# Patient Record
Sex: Female | Born: 1970 | Race: White | Hispanic: No | Smoking: Current some day smoker
Health system: Southern US, Community
[De-identification: ages and names within clinical notes are randomized; demographics above are authoritative.]

## PROBLEM LIST (undated history)

## (undated) DIAGNOSIS — M1990 Unspecified osteoarthritis, unspecified site: Secondary | ICD-10-CM

## (undated) DIAGNOSIS — E785 Hyperlipidemia, unspecified: Secondary | ICD-10-CM

## (undated) DIAGNOSIS — F32A Depression, unspecified: Secondary | ICD-10-CM

## (undated) DIAGNOSIS — J45909 Unspecified asthma, uncomplicated: Secondary | ICD-10-CM

## (undated) DIAGNOSIS — E079 Disorder of thyroid, unspecified: Secondary | ICD-10-CM

## (undated) DIAGNOSIS — F909 Attention-deficit hyperactivity disorder, unspecified type: Secondary | ICD-10-CM

## (undated) DIAGNOSIS — K5792 Diverticulitis of intestine, part unspecified, without perforation or abscess without bleeding: Secondary | ICD-10-CM

## (undated) HISTORY — DX: Unspecified asthma, uncomplicated: J45.909

## (undated) HISTORY — PX: OTHER SURGICAL HISTORY: SHX169

## (undated) HISTORY — DX: Attention-deficit hyperactivity disorder, unspecified type: F90.9

## (undated) HISTORY — DX: Hyperlipidemia, unspecified: E78.5

## (undated) HISTORY — DX: Unspecified osteoarthritis, unspecified site: M19.90

## (undated) HISTORY — DX: Disorder of thyroid, unspecified: E07.9

## (undated) HISTORY — PX: ABDOMINAL HYSTERECTOMY: SHX81

## (undated) HISTORY — PX: APPENDECTOMY: SHX54

## (undated) HISTORY — DX: Depression, unspecified: F32.A

## (undated) HISTORY — DX: Diverticulitis of intestine, part unspecified, without perforation or abscess without bleeding: K57.92

## (undated) HISTORY — PX: KNEE ARTHROSCOPY: SUR90

## (undated) HISTORY — PX: BACK SURGERY: SHX140

---

## 1998-01-05 ENCOUNTER — Other Ambulatory Visit: Admission: RE | Admit: 1998-01-05 | Discharge: 1998-01-05 | Payer: Self-pay | Admitting: Gynecology

## 1999-04-26 ENCOUNTER — Other Ambulatory Visit: Admission: RE | Admit: 1999-04-26 | Discharge: 1999-04-26 | Payer: Self-pay | Admitting: Family Medicine

## 1999-06-09 ENCOUNTER — Ambulatory Visit (HOSPITAL_COMMUNITY): Admission: RE | Admit: 1999-06-09 | Discharge: 1999-06-09 | Payer: Self-pay | Admitting: Gynecology

## 1999-06-09 ENCOUNTER — Encounter (INDEPENDENT_AMBULATORY_CARE_PROVIDER_SITE_OTHER): Payer: Self-pay | Admitting: Specialist

## 2000-05-23 ENCOUNTER — Other Ambulatory Visit: Admission: RE | Admit: 2000-05-23 | Discharge: 2000-05-23 | Payer: Self-pay | Admitting: Gynecology

## 2001-05-26 ENCOUNTER — Other Ambulatory Visit: Admission: RE | Admit: 2001-05-26 | Discharge: 2001-05-26 | Payer: Self-pay | Admitting: Gynecology

## 2002-02-19 ENCOUNTER — Ambulatory Visit (HOSPITAL_COMMUNITY): Admission: RE | Admit: 2002-02-19 | Discharge: 2002-02-19 | Payer: Self-pay | Admitting: Gynecology

## 2002-06-02 ENCOUNTER — Other Ambulatory Visit: Admission: RE | Admit: 2002-06-02 | Discharge: 2002-06-02 | Payer: Self-pay | Admitting: Gynecology

## 2003-06-09 ENCOUNTER — Other Ambulatory Visit: Admission: RE | Admit: 2003-06-09 | Discharge: 2003-06-09 | Payer: Self-pay | Admitting: Obstetrics and Gynecology

## 2004-06-02 ENCOUNTER — Ambulatory Visit: Payer: Self-pay | Admitting: Family Medicine

## 2004-08-03 ENCOUNTER — Ambulatory Visit: Payer: Self-pay | Admitting: Family Medicine

## 2004-11-06 ENCOUNTER — Other Ambulatory Visit: Admission: RE | Admit: 2004-11-06 | Discharge: 2004-11-06 | Payer: Self-pay | Admitting: Obstetrics and Gynecology

## 2005-07-22 ENCOUNTER — Inpatient Hospital Stay (HOSPITAL_COMMUNITY): Admission: AD | Admit: 2005-07-22 | Discharge: 2005-07-22 | Payer: Self-pay | Admitting: Obstetrics and Gynecology

## 2005-12-25 ENCOUNTER — Encounter: Admission: RE | Admit: 2005-12-25 | Discharge: 2005-12-25 | Payer: Self-pay | Admitting: Neurological Surgery

## 2007-01-03 ENCOUNTER — Ambulatory Visit (HOSPITAL_COMMUNITY): Admission: RE | Admit: 2007-01-03 | Discharge: 2007-01-03 | Payer: Self-pay | Admitting: Neurosurgery

## 2008-09-30 DIAGNOSIS — I10 Essential (primary) hypertension: Secondary | ICD-10-CM | POA: Insufficient documentation

## 2008-09-30 DIAGNOSIS — J45909 Unspecified asthma, uncomplicated: Secondary | ICD-10-CM | POA: Insufficient documentation

## 2008-11-29 ENCOUNTER — Emergency Department (HOSPITAL_COMMUNITY): Admission: EM | Admit: 2008-11-29 | Discharge: 2008-11-29 | Payer: Self-pay | Admitting: Emergency Medicine

## 2009-01-16 IMAGING — CT CT CERVICAL SPINE W/ CM
3 of 15 series · 6 of 33 positions shown, 7 images · non-contrast
Comparison: none

CLINICAL DATA: Back pain, leg pain, neck pain, arm pain.
 MYELOGRAM - LUMBAR COMPONENT:
TECHNIQUE: Multidetector CT imaging of the cervical spine was performed after intrathecal injection of contrast.  Multiplanar CT image reconstructions were also generated.
TECHNIQUE: Multidetector CT imaging of the lumbar spine was performed after intrathecal injection of contrast.  Multiplanar CT image reconstructions were also generated.

[Series 5: l-spine helical · axial · 0.28mm/px · z∈[-683,-483]mm · 3 of 81 slices shown, 4 images]
[im 1/81  soft-tissue]
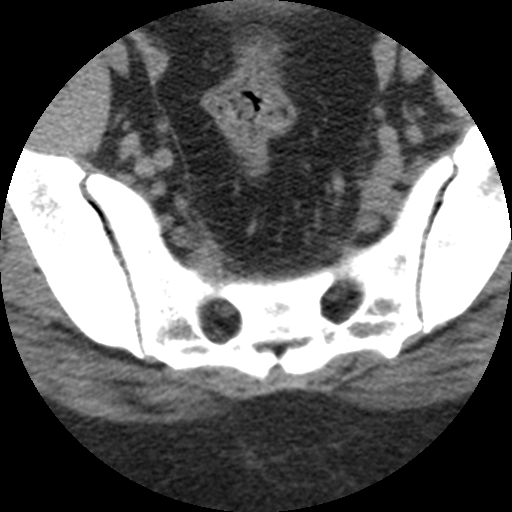
[im 1/81  bone]
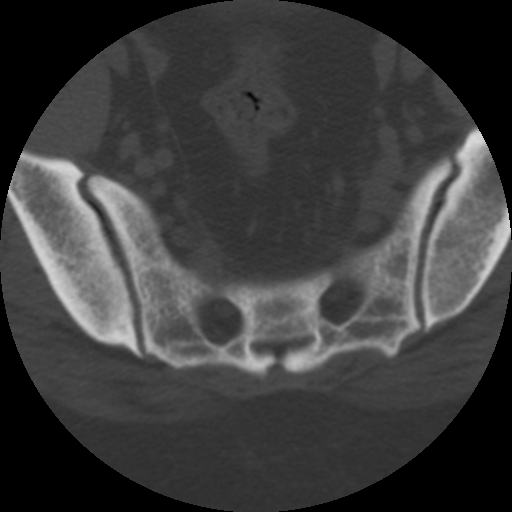
[im 41/81  bone]
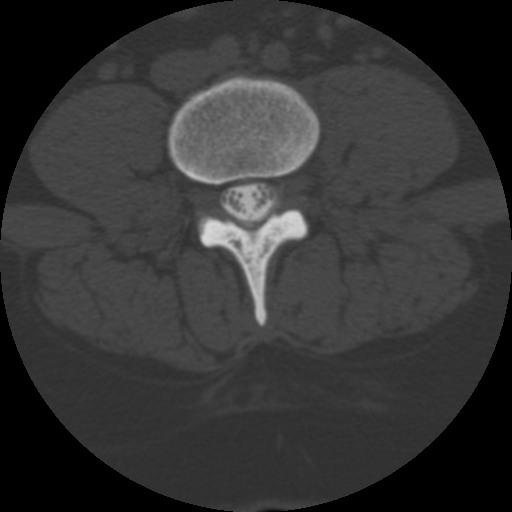
[im 81/81  bone]
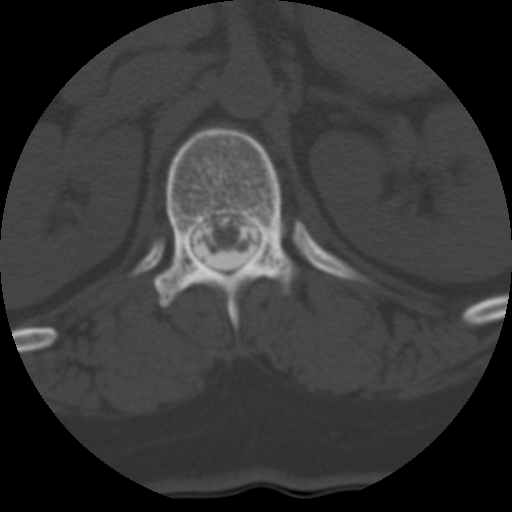

[Series 103: reformatted · sagittal · 0.29mm/px · 2 of 36 slices shown (1 of 2)]
[im 12/36  bone]
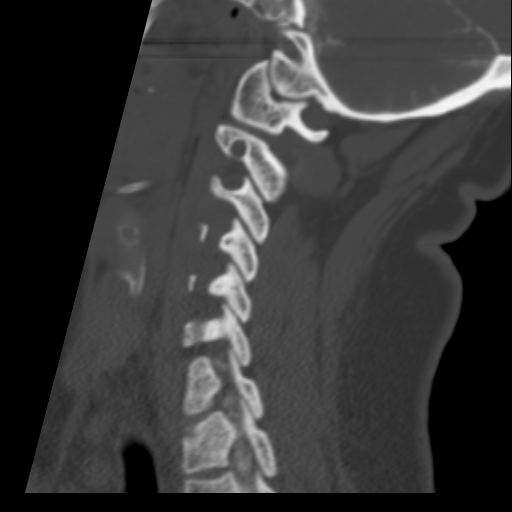
[im 24/36  bone]
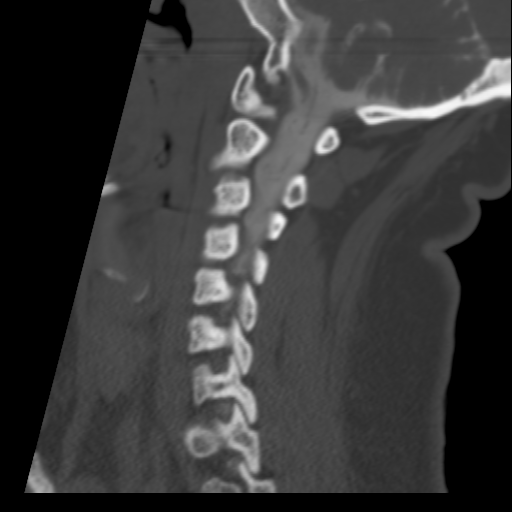

[Series 106: reformatted · sagittal · 0.39mm/px · 1 of 38 slices shown (2 of 2)]
[im 19/38  bone]
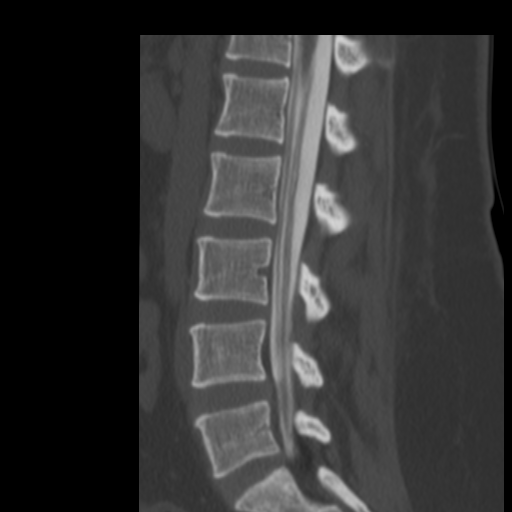

[6 of 33 positions shown; findings below may reference images not displayed]

FINDINGS: Lumbar puncture was performed from a right-sided approach at the L3-4 interlaminar space by Dr. Dzu.
 The lumbar region appears normal without extradural defect or apparent neural compression.
IMPRESSION: Normal lumbar region.
 MYELOGRAM - CERVICAL COMPONENT:
FINDINGS: There is a small anterior extradural defect at C6-7 which does not appear to have any effect upon the spinal cord or root sleeves.  All other levels appear normal.
IMPRESSION: Small anterior extradural defect at C6-7 without apparent neural compression.
 CT CERVICAL SPINE WITH CONTRAST (POST-MYELOGRAM):
FINDINGS: Alignment is normal.  There is a very minimal disc bulge at C6-7, but otherwise the region appears normal.  No effect upon the cord.  No foraminal encroachment.  No facet arthropathy.
IMPRESSION: Negative except for a very minimal disc bulge at C6-7.
 CT LUMBAR SPINE WITH CONTRAST (POST-MYELOGRAM):
FINDINGS: Alignment is normal.  Discs are normal in height and morphology.  The canal and foramina are widely patent.   No osseous or articular abnormality.
IMPRESSION: Normal examination of the lumbar region.

## 2010-05-03 LAB — DIFFERENTIAL
Eosinophils Absolute: 0.1 10*3/uL (ref 0.0–0.7)
Lymphocytes Relative: 24 % (ref 12–46)
Lymphs Abs: 2.6 10*3/uL (ref 0.7–4.0)
Monocytes Relative: 7 % (ref 3–12)
Neutro Abs: 7.2 10*3/uL (ref 1.7–7.7)
Neutrophils Relative %: 68 % (ref 43–77)

## 2010-05-03 LAB — POCT I-STAT, CHEM 8
BUN: 9 mg/dL (ref 6–23)
Calcium, Ion: 1.07 mmol/L — ABNORMAL LOW (ref 1.12–1.32)
Chloride: 107 mEq/L (ref 96–112)
Glucose, Bld: 103 mg/dL — ABNORMAL HIGH (ref 70–99)

## 2010-05-03 LAB — RAPID URINE DRUG SCREEN, HOSP PERFORMED
Amphetamines: NOT DETECTED
Barbiturates: NOT DETECTED
Cocaine: NOT DETECTED
Opiates: NOT DETECTED

## 2010-05-03 LAB — URINALYSIS, ROUTINE W REFLEX MICROSCOPIC
Glucose, UA: NEGATIVE mg/dL
Leukocytes, UA: NEGATIVE
Protein, ur: NEGATIVE mg/dL
Specific Gravity, Urine: 1.02 (ref 1.005–1.030)

## 2010-05-03 LAB — CBC
MCV: 101.3 fL — ABNORMAL HIGH (ref 78.0–100.0)
Platelets: 384 10*3/uL (ref 150–400)
RBC: 4.25 MIL/uL (ref 3.87–5.11)
WBC: 10.6 10*3/uL — ABNORMAL HIGH (ref 4.0–10.5)

## 2010-05-03 LAB — URINE MICROSCOPIC-ADD ON

## 2010-05-03 LAB — ETHANOL: Alcohol, Ethyl (B): <5 mg/dL (ref 0–10)

## 2010-05-03 LAB — URINE CULTURE
Colony Count: NO GROWTH
Culture: NO GROWTH

## 2010-06-16 NOTE — Op Note (Signed)
[facility]  Patient:    Theresa Bowman, Theresa Bowman                 MRN: [id number] Proc. Date: 06/09/99 Adm. Date:  [id number] Disc. Date: [id number] Attending:  Susa Raring                           Operative Report  PREOPERATIVE DIAGNOSES: 1. Abnormal cervical cytology. 2. Inadequate colposcopy.  POSTOPERATIVE DIAGNOSES: 1. Abnormal cervical cytology. 2. Inadequate colposcopy (Pending pathology report).  OPERATION: 1. Cold knife penetration of the cervix. 2. Dilatation and curettage.  SURGEON:  Luvenia Redden, M.D.  DESCRIPTION OF PROCEDURE:  Under good anesthesia the patient was prepped and draped in a sterile manner.  Bimanual examination revealed a small anterior uterus and no adnexal masses.  The cervix was grasped with a tenaculum, and sounded the endometrial cavity to a depth of three inches.  A #1 Vicryl suture was placed submucosally around the circumference of the cervix, approximately one inch up the cervix.  This was placed submucosally in bites around the cervix in the manner of a cerclage.  A #11 knife blade was used to excise a cone-shaped portion of tissue centrally from the cervix.  The specimen was tagged at 12 oclock with a suture.  Following this, the endometrial cavity was scraped and a small amount of tissue was obtained.  The suture was tied down snugly against the uterine sound to assure patency of the canal. The operative site was observed, there was no excessive bleeding.  The procedure was then terminated and the patient removed to the recovery room in good condition.  ESTIMATED BLOOD LOSS:  50 cc; none was replaced. DD:  06/09/99 TD:  06/13/99 Job: [id number]

## 2010-06-16 NOTE — Op Note (Signed)
NAME:  Theresa Bowman, Theresa Bowman                         ACCOUNT NO.:  [id number]   MEDICAL RECORD NO.:  [id number]                   PATIENT TYPE:  AMB   LOCATION:  SDC                                  FACILITY:  [facility]   PHYSICIAN:  Luvenia Redden, M.D.                DATE OF BIRTH:  14-Jul-1970   DATE OF PROCEDURE:  02/19/2002  DATE OF DISCHARGE:                                 OPERATIVE REPORT   PREOPERATIVE DIAGNOSIS:  Sterilization request.   POSTOPERATIVE DIAGNOSIS:  Sterilization request.   OPERATION:  Laparoscopic tubal sterilization.   SURGEON:  Luvenia Redden, M.D.   DESCRIPTION OF PROCEDURE:  Under good anesthesia, the patient was prepped  and draped in a sterile manner.  The bladder was catheterized and a Hulka  tenaculum was placed on the uterus to achieve mobility.  A subumbilical  incision was made approximately 1 cm in length.  The Veress needle was  introduced into the abdomen.  However, I could not be really comfortable  with the pressure readings and I was not that sure that really there was  placement was good and never did get a single digit pressure reading even an  attempt to introduce the needle through the cul-de-sac was not successful.  The decision was made to change to the open laparoscopic approach, which I  did by enlarging the incision just a tad.  Subcutaneous tissues were  separated bluntly.  The fascia was reached and incised vertically.  Blunt  dissection mobilized the rectus muscle to laterally.  The peritoneum was  identified, incised sharply, and the peritoneal cavity was opened.  On  feeling in the peritoneal cavity, it was smooth.  Although, it felt like  some omental adhesions just to the right of the midline and to the right of  the incision.  At any rate, the cannula was placed in the incision  intraperitoneally and the scope was introduced.  The peritoneal cavity was  then insufflated with carbon dioxide and pneumoperitoneum was achieved.  There  were in fact some adhesions to the anterior abdominal wall towards the  left lower quadrant.  These were not disrupted nor were they taken down  since the patient had no complaints.  The fallopian tubes and ovaries were  reviewed and they were entirely normal.  The uterus was normal.  The pelvic  peritoneum was normal.  The right fallopian tube was identified throughout  its length.  It was grasped at the junction of the proximal middle thirds,  tented up and cauterized with the bipolar instrument to zero.  Once the tube  was cauterized, the mid portion of the cauterized area was transected and  there was no bleeding from this site.  The left fallopian tube was  identified throughout its length and isolated.  The tube was cauterized in a  junction of the proximal middle thirds and the cauterized area was then  transected in the mid portion.  There was no bleeding from these sites at  regular pressure nor under reduced pressure.  The pneumoperitoneum was then  totally released.  Scope and cannula were removed.  The fascia was then  closed using interrupted figure-of-eight zero Vicryl sutures.  Subcutaneous  tissue was approximated with some Vicryl interrupted sutures and the skin  was closed with a subcuticular 3-0 plain catgut.  Dermabond was applied and  a Band-Aid was applied.  The patient tolerated the procedure well and was  removed to recovery in good condition.  Estimated blood loss was 20 cc or  less, none was replaced.                                                Luvenia Redden, M.D.    WSB/MEDQ  D:  02/19/2002  T:  02/19/2002  Job:  [id number]

## 2015-09-30 ENCOUNTER — Other Ambulatory Visit: Payer: Self-pay | Admitting: Orthopedic Surgery

## 2015-09-30 DIAGNOSIS — M545 Low back pain: Secondary | ICD-10-CM

## 2015-10-17 ENCOUNTER — Encounter (HOSPITAL_COMMUNITY): Payer: Self-pay | Admitting: *Deleted

## 2015-12-12 DIAGNOSIS — N179 Acute kidney failure, unspecified: Secondary | ICD-10-CM

## 2015-12-12 DIAGNOSIS — T1491XA Suicide attempt, initial encounter: Secondary | ICD-10-CM

## 2015-12-12 DIAGNOSIS — F1910 Other psychoactive substance abuse, uncomplicated: Secondary | ICD-10-CM

## 2016-08-14 ENCOUNTER — Telehealth (INDEPENDENT_AMBULATORY_CARE_PROVIDER_SITE_OTHER): Payer: Self-pay | Admitting: Orthopedic Surgery

## 2016-08-14 NOTE — Telephone Encounter (Signed)
Returned call to patient got recording voicemail box have not been set up yet.   Could not leave message

## 2016-08-29 ENCOUNTER — Ambulatory Visit (INDEPENDENT_AMBULATORY_CARE_PROVIDER_SITE_OTHER): Payer: Self-pay | Admitting: Orthopedic Surgery

## 2016-08-29 ENCOUNTER — Encounter (INDEPENDENT_AMBULATORY_CARE_PROVIDER_SITE_OTHER): Payer: Self-pay | Admitting: Orthopedic Surgery

## 2016-08-29 DIAGNOSIS — G8929 Other chronic pain: Secondary | ICD-10-CM

## 2016-08-29 DIAGNOSIS — M5441 Lumbago with sciatica, right side: Secondary | ICD-10-CM
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
## 2016-08-31 ENCOUNTER — Telehealth (INDEPENDENT_AMBULATORY_CARE_PROVIDER_SITE_OTHER): Payer: Self-pay

## 2016-08-31 MED ORDER — GABAPENTIN 100 MG PO CAPS: 100.0000 mg | ORAL_CAPSULE | Freq: Three times a day (TID) | ORAL | 0 refills | Status: DC

## 2016-08-31 NOTE — Telephone Encounter (Signed)
Patient stated that Rx for Neurontin was not at her pharmacy.  Would like for Rx to be sent to [facility].  Cb# is [phone].  Please advise.  Thank You.

## 2016-08-31 NOTE — Addendum Note (Signed)
Addended byPrescott Parma: Ladine Kiper on: 08/31/2016 10:43 AM   Modules accepted: Orders

## 2016-08-31 NOTE — Telephone Encounter (Signed)
Resubmitted. Advised patient.

## 2016-09-08 ENCOUNTER — Other Ambulatory Visit: Payer: Self-pay

## 2020-06-28 ENCOUNTER — Ambulatory Visit: Payer: Self-pay | Admitting: Nurse Practitioner
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
## 2020-07-13 ENCOUNTER — Other Ambulatory Visit: Payer: Self-pay | Admitting: Nurse Practitioner

## 2020-07-13 ENCOUNTER — Encounter: Payer: Self-pay | Admitting: Nurse Practitioner

## 2020-07-13 ENCOUNTER — Other Ambulatory Visit: Payer: Self-pay

## 2020-07-13 ENCOUNTER — Ambulatory Visit (INDEPENDENT_AMBULATORY_CARE_PROVIDER_SITE_OTHER): Payer: 59 | Admitting: Nurse Practitioner

## 2020-07-13 VITALS — BP 124/80 | HR 92 | Resp 20 | Ht 60.0 in | Wt 200.0 lb

## 2020-07-13 DIAGNOSIS — E079 Disorder of thyroid, unspecified: Secondary | ICD-10-CM

## 2020-07-13 DIAGNOSIS — F1210 Cannabis abuse, uncomplicated: Secondary | ICD-10-CM

## 2020-07-13 DIAGNOSIS — F1910 Other psychoactive substance abuse, uncomplicated: Secondary | ICD-10-CM

## 2020-07-13 DIAGNOSIS — G8929 Other chronic pain: Secondary | ICD-10-CM

## 2020-07-13 DIAGNOSIS — Z1231 Encounter for screening mammogram for malignant neoplasm of breast: Secondary | ICD-10-CM

## 2020-07-13 DIAGNOSIS — R748 Abnormal levels of other serum enzymes: Secondary | ICD-10-CM | POA: Diagnosis not present

## 2020-07-13 DIAGNOSIS — Z7689 Persons encountering health services in other specified circumstances: Secondary | ICD-10-CM

## 2020-07-13 DIAGNOSIS — F1121 Opioid dependence, in remission: Secondary | ICD-10-CM

## 2020-07-13 DIAGNOSIS — F1320 Sedative, hypnotic or anxiolytic dependence, uncomplicated: Secondary | ICD-10-CM

## 2020-07-13 MED ORDER — GABAPENTIN 100 MG PO CAPS: 100.0000 mg | ORAL_CAPSULE | Freq: Three times a day (TID) | ORAL | 1 refills | Status: DC

## 2020-07-13 NOTE — Patient Instructions (Signed)
Return tomorrow for fasting labs  Preventive Care 59-48 Years Old, Female Preventive care refers to lifestyle choices and visits with your health care provider that can promote health and wellness. This includes: A yearly physical exam. This is also called an annual wellness visit. Regular dental and eye exams. Immunizations. Screening for certain conditions. Healthy lifestyle choices, such as: Eating a healthy diet. Getting regular exercise. Not using drugs or products that contain nicotine and tobacco. Limiting alcohol use. What can I expect for my preventive care visit? Physical exam Your health care provider will check your: Height and weight. These may be used to calculate your BMI (body mass index). BMI is a measurement that tells if you are at a healthy weight. Heart rate and blood pressure. Body temperature. Skin for abnormal spots. Counseling Your health care provider may ask you questions about your: Past medical problems. Family's medical history. Alcohol, tobacco, and drug use. Emotional well-being. Home life and relationship well-being. Sexual activity. Diet, exercise, and sleep habits. Work and work Statistician. Access to firearms. Method of birth control. Menstrual cycle. Pregnancy history. What immunizations do I need?  Vaccines are usually given at various ages, according to a schedule. Your health care provider will recommend vaccines for you based on your age, medicalhistory, and lifestyle or other factors, such as travel or where you work. What tests do I need? Blood tests Lipid and cholesterol levels. These may be checked every 5 years, or more often if you are over 81 years old. Hepatitis C test. Hepatitis B test. Screening Lung cancer screening. You may have this screening every year starting at age 51 if you have a 30-pack-year history of smoking and currently smoke or have quit within the past 15 years. Colorectal cancer screening. All adults  should have this screening starting at age 31 and continuing until age 67. Your health care provider may recommend screening at age 63 if you are at increased risk. You will have tests every 1-10 years, depending on your results and the type of screening test. Diabetes screening. This is done by checking your blood sugar (glucose) after you have not eaten for a while (fasting). You may have this done every 1-3 years. Mammogram. This may be done every 1-2 years. Talk with your health care provider about when you should start having regular mammograms. This may depend on whether you have a family history of breast cancer. BRCA-related cancer screening. This may be done if you have a family history of breast, ovarian, tubal, or peritoneal cancers. Pelvic exam and Pap test. This may be done every 3 years starting at age 55. Starting at age 3, this may be done every 5 years if you have a Pap test in combination with an HPV test. Other tests STD (sexually transmitted disease) testing, if you are at risk. Bone density scan. This is done to screen for osteoporosis. You may have this scan if you are at high risk for osteoporosis. Talk with your health care provider about your test results, treatment options,and if necessary, the need for more tests. Follow these instructions at home: Eating and drinking  Eat a diet that includes fresh fruits and vegetables, whole grains, lean protein, and low-fat dairy products. Take vitamin and mineral supplements as recommended by your health care provider. Do not drink alcohol if: Your health care provider tells you not to drink. You are pregnant, may be pregnant, or are planning to become pregnant. If you drink alcohol: Limit how much you have to  0-1 drink a day. Be aware of how much alcohol is in your drink. In the [place] one drink equals one 12 oz bottle of beer (355 mL), one 5 oz glass of wine (148 mL), or one 1 oz glass of hard liquor (44  mL).  Lifestyle Take daily care of your teeth and gums. Brush your teeth every morning and night with fluoride toothpaste. Floss one time each day. Stay active. Exercise for at least 30 minutes 5 or more days each week. Do not use any products that contain nicotine or tobacco, such as cigarettes, e-cigarettes, and chewing tobacco. If you need help quitting, ask your health care provider. Do not use drugs. If you are sexually active, practice safe sex. Use a condom or other form of protection to prevent STIs (sexually transmitted infections). If you do not wish to become pregnant, use a form of birth control. If you plan to become pregnant, see your health care provider for a prepregnancy visit. If told by your health care provider, take low-dose aspirin daily starting at age 42. Find healthy ways to cope with stress, such as: Meditation, yoga, or listening to music. Journaling. Talking to a trusted person. Spending time with friends and family. Safety Always wear your seat belt while driving or riding in a vehicle. Do not drive: If you have been drinking alcohol. Do not ride with someone who has been drinking. When you are tired or distracted. While texting. Wear a helmet and other protective equipment during sports activities. If you have firearms in your house, make sure you follow all gun safety procedures. What's next? Visit your health care provider once a year for an annual wellness visit. Ask your health care provider how often you should have your eyes and teeth checked. Stay up to date on all vaccines. This information is not intended to replace advice given to you by your health care provider. Make sure you discuss any questions you have with your healthcare provider. Document Revised: 10/20/2019 Document Reviewed: 09/26/2017 Elsevier Patient Education  [place], Female Adopting a healthy lifestyle and getting preventive care are important in  promoting health and wellness. Ask your health care provider about: The right schedule for you to have regular tests and exams. Things you can do on your own to prevent diseases and keep yourself healthy. What should I know about diet, weight, and exercise? Eat a healthy diet  Eat a diet that includes plenty of vegetables, fruits, low-fat dairy products, and lean protein. Do not eat a lot of foods that are high in solid fats, added sugars, or sodium.  Maintain a healthy weight Body mass index (BMI) is used to identify weight problems. It estimates body fat based on height and weight. Your health care provider can help determineyour BMI and help you achieve or maintain a healthy weight. Get regular exercise Get regular exercise. This is one of the most important things you can do for your health. Most adults should: Exercise for at least 150 minutes each week. The exercise should increase your heart rate and make you sweat (moderate-intensity exercise). Do strengthening exercises at least twice a week. This is in addition to the moderate-intensity exercise. Spend less time sitting. Even light physical activity can be beneficial. Watch cholesterol and blood lipids Have your blood tested for lipids and cholesterol at 48 years of age, then havethis test every 5 years. Have your cholesterol levels checked more often if: Your lipid or cholesterol levels are high. You  are older than 48 years of age. You are at high risk for heart disease. What should I know about cancer screening? Depending on your health history and family history, you may need to have cancer screening at various ages. This may include screening for: Breast cancer. Cervical cancer. Colorectal cancer. Skin cancer. Lung cancer. What should I know about heart disease, diabetes, and high blood pressure? Blood pressure and heart disease High blood pressure causes heart disease and increases the risk of stroke. This is more likely  to develop in people who have high blood pressure readings, are of African descent, or are overweight. Have your blood pressure checked: Every 3-5 years if you are 40-63 years of age. Every year if you are 65 years old or older. Diabetes Have regular diabetes screenings. This checks your fasting blood sugar level. Have the screening done: Once every three years after age 42 if you are at a normal weight and have a low risk for diabetes. More often and at a younger age if you are overweight or have a high risk for diabetes. What should I know about preventing infection? Hepatitis B If you have a higher risk for hepatitis B, you should be screened for this virus. Talk with your health care provider to find out if you are at risk forhepatitis B infection. Hepatitis C Testing is recommended for: Everyone born from [birth year] through 1963. Anyone with known risk factors for hepatitis C. Sexually transmitted infections (STIs) Get screened for STIs, including gonorrhea and chlamydia, if: You are sexually active and are younger than 48 years of age. You are older than 48 years of age and your health care provider tells you that you are at risk for this type of infection. Your sexual activity has changed since you were last screened, and you are at increased risk for chlamydia or gonorrhea. Ask your health care provider if you are at risk. Ask your health care provider about whether you are at high risk for HIV. Your health care provider may recommend a prescription medicine to help prevent HIV infection. If you choose to take medicine to prevent HIV, you should first get tested for HIV. You should then be tested every 3 months for as long as you are taking the medicine. Pregnancy If you are about to stop having your period (premenopausal) and you may become pregnant, seek counseling before you get pregnant. Take 400 to 800 micrograms (mcg) of folic acid every day if you become pregnant. Ask for birth  control (contraception) if you want to prevent pregnancy. Osteoporosis and menopause Osteoporosis is a disease in which the bones lose minerals and strength with aging. This can result in bone fractures. If you are 40 years old or older, or if you are at risk for osteoporosis and fractures, ask your health care provider if you should: Be screened for bone loss. Take a calcium or vitamin D supplement to lower your risk of fractures. Be given hormone replacement therapy (HRT) to treat symptoms of menopause. Follow these instructions at home: Lifestyle Do not use any products that contain nicotine or tobacco, such as cigarettes, e-cigarettes, and chewing tobacco. If you need help quitting, ask your health care provider. Do not use street drugs. Do not share needles. Ask your health care provider for help if you need support or information about quitting drugs. Alcohol use Do not drink alcohol if: Your health care provider tells you not to drink. You are pregnant, may be pregnant, or are planning to  become pregnant. If you drink alcohol: Limit how much you use to 0-1 drink a day. Limit intake if you are breastfeeding. Be aware of how much alcohol is in your drink. In the [place] one drink equals one 12 oz bottle of beer (355 mL), one 5 oz glass of wine (148 mL), or one 1 oz glass of hard liquor (44 mL). General instructions Schedule regular health, dental, and eye exams. Stay current with your vaccines. Tell your health care provider if: You often feel depressed. You have ever been abused or do not feel safe at home. Summary Adopting a healthy lifestyle and getting preventive care are important in promoting health and wellness. Follow your health care provider's instructions about healthy diet, exercising, and getting tested or screened for diseases. Follow your health care provider's instructions on monitoring your cholesterol and blood pressure. This information is not intended to replace  advice given to you by your health care provider. Make sure you discuss any questions you have with your healthcare provider. Document Revised: 01/08/2018 Document Reviewed: 01/08/2018 Elsevier Patient Education  2022 [employer].

## 2020-07-14 ENCOUNTER — Other Ambulatory Visit: Payer: 59

## 2020-07-14 NOTE — Addendum Note (Signed)
Addended by: Janie Morning on: 07/14/2020 10:29 AM   Modules accepted: Orders

## 2020-07-15 ENCOUNTER — Other Ambulatory Visit: Payer: 59

## 2020-07-16 LAB — HCV INTERPRETATION

## 2020-07-16 LAB — CBC WITH DIFFERENTIAL/PLATELET
Basophils Absolute: 0.1 10*3/uL (ref 0.0–0.2)
Basos: 1 %
EOS (ABSOLUTE): 0.4 10*3/uL (ref 0.0–0.4)
Eos: 4 %
Hematocrit: 40.7 % (ref 34.0–46.6)
Hemoglobin: 13.6 g/dL (ref 11.1–15.9)
Immature Grans (Abs): 0 10*3/uL (ref 0.0–0.1)
Immature Granulocytes: 0 %
Lymphocytes Absolute: 2.6 10*3/uL (ref 0.7–3.1)
Lymphs: 31 %
MCH: 30.6 pg (ref 26.6–33.0)
MCHC: 33.4 g/dL (ref 31.5–35.7)
MCV: 92 fL (ref 79–97)
Monocytes Absolute: 0.7 10*3/uL (ref 0.1–0.9)
Monocytes: 8 %
Neutrophils Absolute: 4.6 10*3/uL (ref 1.4–7.0)
Neutrophils: 56 %
Platelets: 456 10*3/uL — ABNORMAL HIGH (ref 150–450)
RBC: 4.45 x10E6/uL (ref 3.77–5.28)
RDW: 13.1 % (ref 11.7–15.4)
WBC: 8.3 10*3/uL (ref 3.4–10.8)

## 2020-07-16 LAB — COMPREHENSIVE METABOLIC PANEL
ALT: 13 IU/L (ref 0–32)
AST: 14 IU/L (ref 0–40)
Albumin/Globulin Ratio: 1.5 (ref 1.2–2.2)
Albumin: 4.1 g/dL (ref 3.8–4.8)
Alkaline Phosphatase: 157 IU/L — ABNORMAL HIGH (ref 44–121)
BUN/Creatinine Ratio: 12 (ref 9–23)
BUN: 9 mg/dL (ref 6–24)
Bilirubin Total: <0.2 mg/dL (ref 0.0–1.2)
CO2: 20 mmol/L (ref 20–29)
Calcium: 8.8 mg/dL (ref 8.7–10.2)
Chloride: 101 mmol/L (ref 96–106)
Creatinine, Ser: 0.77 mg/dL (ref 0.57–1.00)
Globulin, Total: 2.8 g/dL (ref 1.5–4.5)
Glucose: 148 mg/dL — ABNORMAL HIGH (ref 65–99)
Potassium: 4.2 mmol/L (ref 3.5–5.2)
Sodium: 141 mmol/L (ref 134–144)
Total Protein: 6.9 g/dL (ref 6.0–8.5)
eGFR: 96 mL/min/{1.73_m2} (ref >59)

## 2020-07-16 LAB — TSH: TSH: 1.87 u[IU]/mL (ref 0.450–4.500)

## 2020-07-16 LAB — LIPID PANEL
Chol/HDL Ratio: 5.1 ratio — ABNORMAL HIGH (ref 0.0–4.4)
Cholesterol, Total: 205 mg/dL — ABNORMAL HIGH (ref 100–199)
HDL: 40 mg/dL (ref >39)
LDL Chol Calc (NIH): 113 mg/dL — ABNORMAL HIGH (ref 0–99)
Triglycerides: 298 mg/dL — ABNORMAL HIGH (ref 0–149)
VLDL Cholesterol Cal: 52 mg/dL — ABNORMAL HIGH (ref 5–40)

## 2020-07-16 LAB — ACUTE VIRAL HEPATITIS (HAV, HBV, HCV)
HCV Ab: <0.1 s/co ratio (ref 0.0–0.9)
Hep A IgM: POSITIVE — AB
Hep B C IgM: NEGATIVE
Hepatitis B Surface Ag: NEGATIVE

## 2020-07-16 LAB — VITAMIN D 25 HYDROXY (VIT D DEFICIENCY, FRACTURES): Vit D, 25-Hydroxy: 14.6 ng/mL — ABNORMAL LOW (ref 30.0–100.0)

## 2020-07-16 LAB — CARDIOVASCULAR RISK ASSESSMENT

## 2020-07-18 ENCOUNTER — Telehealth: Payer: Self-pay

## 2020-07-18 NOTE — Telephone Encounter (Signed)
Pt calling states gabapentin was called in but wrong dosage. States she has been taking 600 mg three times daily "for quite a while." Would like this to be fixed.   Terrill Mohr 07/18/20 3:32 PM

## 2020-07-19 NOTE — Telephone Encounter (Signed)
Made pt aware. Pt Theresa Bowman and would like Dr Sedalia Muta to review when she comes in as she is decreasing dose.   Terrill Mohr 07/19/20 4:43 PM

## 2020-07-20 ENCOUNTER — Other Ambulatory Visit: Payer: Self-pay

## 2020-07-20 DIAGNOSIS — Z1231 Encounter for screening mammogram for malignant neoplasm of breast: Secondary | ICD-10-CM

## 2020-07-20 MED ORDER — VITAMIN D (ERGOCALCIFEROL) 1.25 MG (50000 UNIT) PO CAPS: 50000.0000 [IU] | ORAL_CAPSULE | ORAL | 0 refills | Status: DC

## 2020-07-20 MED ORDER — ROSUVASTATIN CALCIUM 10 MG PO TABS: 10.0000 mg | ORAL_TABLET | Freq: Every day | ORAL | 0 refills | Status: DC

## 2020-07-25 ENCOUNTER — Other Ambulatory Visit: Payer: Self-pay | Admitting: Nurse Practitioner

## 2020-07-25 DIAGNOSIS — G8929 Other chronic pain: Secondary | ICD-10-CM

## 2020-08-04 ENCOUNTER — Telehealth: Payer: Self-pay

## 2020-08-04 NOTE — Telephone Encounter (Signed)
Patient called requesting increase of her gabapentin, patient's provider Flonnie Hailstone DNP was consulted with. Per Flonnie Hailstone DNP, we will not  increase gabapentin and that patient will need to be referred to the pain management clinic. Called patient back to inform her of the response from her provider and she stated that she wished she never would of switched doctors and she did not understand how pain management would help her because she will not take pain pills. I tried explaining to patient that pain management does not just give out medication that they actually seek out the pain issue and better treat it and would know better how to approach her on going chronic low back pain. Patient still did not like the response but stated that she might try it and she was not going to be able to walk because we would not increase her gabapentin. I told her the recommendations once again and was hung up on. Patient informed.

## 2020-08-09 ENCOUNTER — Inpatient Hospital Stay (HOSPITAL_BASED_OUTPATIENT_CLINIC_OR_DEPARTMENT_OTHER): Admission: RE | Admit: 2020-08-09 | Payer: 59 | Source: Ambulatory Visit

## 2020-08-10 LAB — SPECIMEN STATUS REPORT

## 2020-08-10 LAB — HGB A1C W/O EAG: Hgb A1c MFr Bld: 6.3 % — ABNORMAL HIGH (ref 4.8–5.6)

## 2020-09-19 ENCOUNTER — Other Ambulatory Visit: Payer: Self-pay | Admitting: Family Medicine

## 2020-09-19 DIAGNOSIS — G8929 Other chronic pain: Secondary | ICD-10-CM

## 2020-10-17 ENCOUNTER — Encounter: Payer: Self-pay | Admitting: Nurse Practitioner

## 2020-10-17 ENCOUNTER — Ambulatory Visit (INDEPENDENT_AMBULATORY_CARE_PROVIDER_SITE_OTHER): Payer: 59 | Admitting: Nurse Practitioner

## 2020-10-17 ENCOUNTER — Other Ambulatory Visit: Payer: Self-pay

## 2020-10-17 VITALS — BP 130/78 | HR 82 | Temp 98.2°F | Ht 60.0 in | Wt 195.0 lb

## 2020-10-17 DIAGNOSIS — F322 Major depressive disorder, single episode, severe without psychotic features: Secondary | ICD-10-CM | POA: Diagnosis not present

## 2020-10-17 DIAGNOSIS — E782 Mixed hyperlipidemia: Secondary | ICD-10-CM

## 2020-10-17 DIAGNOSIS — I10 Essential (primary) hypertension: Secondary | ICD-10-CM | POA: Diagnosis not present

## 2020-10-17 DIAGNOSIS — F411 Generalized anxiety disorder: Secondary | ICD-10-CM | POA: Diagnosis not present

## 2020-10-17 DIAGNOSIS — F17218 Nicotine dependence, cigarettes, with other nicotine-induced disorders: Secondary | ICD-10-CM

## 2020-10-17 DIAGNOSIS — M792 Neuralgia and neuritis, unspecified: Secondary | ICD-10-CM

## 2020-10-17 MED ORDER — GABAPENTIN 300 MG PO CAPS: 300.0000 mg | ORAL_CAPSULE | Freq: Three times a day (TID) | ORAL | 3 refills | Status: DC

## 2020-10-17 MED ORDER — CITALOPRAM HYDROBROMIDE 20 MG PO TABS: 20.0000 mg | ORAL_TABLET | Freq: Every day | ORAL | 3 refills | Status: DC

## 2020-10-17 NOTE — Patient Instructions (Addendum)
Begin Citalopram 20 mg daily Continue medications Increase Gabapentin to 300 mg three times daily  We will call you with referral to pain clinic and lab results Call [facility] to set up appointment [phone] Recommend routing eye exam Recommend smoking cessation Follow-up in 4-weeks  Managing Anxiety, Adult After being diagnosed with an anxiety disorder, you may be relieved to know why you have felt or behaved a certain way. You may also feel overwhelmed about the treatment ahead and what it will mean for your life. With care and support, you can manage this condition and recover from it. How to manage lifestyle changes Managing stress and anxiety Stress is your body's reaction to life changes and events, both good and bad. Most stress will last just a few hours, but stress can be ongoing and can lead to more than just stress. Although stress can play a major role in anxiety, it is not the same as anxiety. Stress is usually caused by something external, such as a deadline, test, or competition. Stress normally passes after the triggering event has ended.  Anxiety is caused by something internal, such as imagining a terrible outcome or worrying that something will go wrong that will devastate you. Anxiety often does not go away even after the triggering event is over, and it can become long-term (chronic) worry. It is important to understand the differences between stress and anxiety and to manage your stress effectively so that it does not lead to an anxious response. Talk with your health care provider or a counselor to learn more about reducing anxiety and stress. He or she may suggest tension reduction techniques, such as: Music therapy. This can include creating or listening to music that you enjoy and that inspires you. Mindfulness-based meditation. This involves being aware of your normal breaths while not trying to control your breathing. It can be done while sitting or  walking. Centering prayer. This involves focusing on a word, phrase, or sacred image that means something to you and brings you peace. Deep breathing. To do this, expand your stomach and inhale slowly through your nose. Hold your breath for 3-5 seconds. Then exhale slowly, letting your stomach muscles relax. Self-talk. This involves identifying thought patterns that lead to anxiety reactions and changing those patterns. Muscle relaxation. This involves tensing muscles and then relaxing them. Choose a tension reduction technique that suits your lifestyle and personality. These techniques take time and practice. Set aside 5-15 minutes a day to do them. Therapists can offer counseling and training in these techniques. The training to help with anxiety may be covered by some insurance plans. Other things you can do to manage stress and anxiety include: Keeping a stress/anxiety diary. This can help you learn what triggers your reaction and then learn ways to manage your response. Thinking about how you react to certain situations. You may not be able to control everything, but you can control your response. Making time for activities that help you relax and not feeling guilty about spending your time in this way. Visual imagery and yoga can help you stay calm and relax.  Medicines Medicines can help ease symptoms. Medicines for anxiety include: Anti-anxiety drugs. Antidepressants. Medicines are often used as a primary treatment for anxiety disorder. Medicines will be prescribed by a health care provider. When used together, medicines, psychotherapy, and tension reduction techniques may be the most effective treatment. Relationships Relationships can play a big part in helping you recover. Try to spend more time connecting with trusted  friends and family members. Consider going to couples counseling, taking family education classes, or going to family therapy. Therapy can help you and others better  understand your condition. How to recognize changes in your anxiety Everyone responds differently to treatment for anxiety. Recovery from anxiety happens when symptoms decrease and stop interfering with your daily activities at home or work. This may mean that you will start to: Have better concentration and focus. Worry will interfere less in your daily thinking. Sleep better. Be less irritable. Have more energy. Have improved memory. It is important to recognize when your condition is getting worse. Contact your health care provider if your symptoms interfere with home or work and you feel like your condition is not improving. Follow these instructions at home: Activity Exercise. Most adults should do the following: Exercise for at least 150 minutes each week. The exercise should increase your heart rate and make you sweat (moderate-intensity exercise). Strengthening exercises at least twice a week. Get the right amount and quality of sleep. Most adults need 7-9 hours of sleep each night. Lifestyle  Eat a healthy diet that includes plenty of vegetables, fruits, whole grains, low-fat dairy products, and lean protein. Do not eat a lot of foods that are high in solid fats, added sugars, or salt. Make choices that simplify your life. Do not use any products that contain nicotine or tobacco, such as cigarettes, e-cigarettes, and chewing tobacco. If you need help quitting, ask your health care provider. Avoid caffeine, alcohol, and certain over-the-counter cold medicines. These may make you feel worse. Ask your pharmacist which medicines to avoid. General instructions Take over-the-counter and prescription medicines only as told by your health care provider. Keep all follow-up visits as told by your health care provider. This is important. Where to find support You can get help and support from these sources: Self-help groups. Online and [employer]. A trusted spiritual  leader. Couples counseling. Family education classes. Family therapy. Where to find more information You may find that joining a support group helps you deal with your anxiety. The following sources can help you locate counselors or support groups near you: Mental Health America: www.mentalhealthamerica.net Anxiety and Depression Association of [place] (ADAA): [employer] on Mental Illness (NAMI): www.nami.org Contact a health care provider if you: Have a hard time staying focused or finishing daily tasks. Spend many hours a day feeling worried about everyday life. Become exhausted by worry. Start to have headaches, feel tense, or have nausea. Urinate more than normal. Have diarrhea. Get help right away if you have: A racing heart and shortness of breath. Thoughts of hurting yourself or others. If you ever feel like you may hurt yourself or others, or have thoughts about taking your own life, get help right away. You can go to your nearest emergency department or call: Your local emergency services (911 in the [place] A suicide crisis helpline, such as the National Suicide Prevention Lifeline at (502) 847-7238. This is open 24 hours a day. Summary Taking steps to learn and use tension reduction techniques can help calm you and help prevent triggering an anxiety reaction. When used together, medicines, psychotherapy, and tension reduction techniques may be the most effective treatment. Family, friends, and partners can play a big part in helping you recover from an anxiety disorder. This information is not intended to replace advice given to you by your health care provider. Make sure you discuss any questions you have with your health care provider. Document Revised: 06/17/2018 Document Reviewed:  06/17/2018 Elsevier Patient Education  2022 [employer] Managing Depression, Adult Depression is a mental health condition that affects your thoughts, feelings, and  actions. Being diagnosed with depression can bring you relief if you did not know why you have felt or behaved a certain way. It could also leave you feeling overwhelmed with uncertainty about your future. Preparing yourself to manage your symptoms can help you feel more positive about your future. How to manage lifestyle changes Managing stress Stress is your body's reaction to life changes and events, both good and bad. Stress can add to your feelings of depression. Learning to manage your stress can help lessen your feelings of depression. Try some of the following approaches to reducing your stress (stress reduction techniques): Listen to music that you enjoy and that inspires you. Try using a meditation app or take a meditation class. Develop a practice that helps you connect with your spiritual self. Walk in nature, pray, or go to a place of worship. Do some deep breathing. To do this, inhale slowly through your nose. Pause at the top of your inhale for a few seconds and then exhale slowly, letting your muscles relax. Practice yoga to help relax and work your muscles. Choose a stress reduction technique that suits your lifestyle and personality. These techniques take time and practice to develop. Set aside 5-15 minutes a day to do them. Therapists can offer training in these techniques. Other things you can do to manage stress include: Keeping a stress diary. Knowing your limits and saying no when you think something is too much. Paying attention to how you react to certain situations. You may not be able to control everything, but you can change your reaction. Adding humor to your life by watching funny films or TV shows. Making time for activities that you enjoy and that relax you.  Medicines Medicines, such as antidepressants, are often a part of treatment for depression. Talk with your pharmacist or health care provider about all the medicines, supplements, and herbal products that you  take, their possible side effects, and what medicines and other products are safe to take together. Make sure to report any side effects you may have to your health care provider. Relationships Your health care provider may suggest family therapy, couples therapy, or individual therapy as part of your treatment. How to recognize changes Everyone responds differently to treatment for depression. As you recover from depression, you may start to: Have more interest in doing activities. Feel less hopeless. Have more energy. Overeat less often, or have a better appetite. Have better mental focus. It is important to recognize if your depression is not getting better or is getting worse. The symptoms you had in the beginning may return, such as: Tiredness (fatigue) or low energy. Eating too much or too little. Sleeping too much or too little. Feeling restless, agitated, or hopeless. Trouble focusing or making decisions. Unexplained physical complaints. Feeling irritable, angry, or aggressive. If you or your family members notice these symptoms coming back, let your health care provider know right away. Follow these instructions at home: Activity  Try to get some form of exercise each day, such as walking, biking, swimming, or lifting weights. Practice stress reduction techniques. Engage your mind by taking a class or doing some volunteer work. Lifestyle Get the right amount and quality of sleep. Cut down on using caffeine, tobacco, alcohol, and other potentially harmful substances. Eat a healthy diet that includes plenty of vegetables, fruits, whole grains, low-fat dairy  products, and lean protein. Do not eat a lot of foods that are high in solid fats, added sugars, or salt (sodium). General instructions Take over-the-counter and prescription medicines only as told by your health care provider. Keep all follow-up visits as told by your health care provider. This is important. Where to find  support Talking to others Friends and family members can be sources of support and guidance. Talk to trusted friends or family members about your condition. Explain your symptoms to them, and let them know that you are working with a health care provider to treat your depression. Tell friends and family members how they also can be helpful. Finances Find appropriate mental health providers that fit with your financial situation. Talk with your health care provider about options to get reduced prices on your medicines. Where to find more information You can find support in your area from: Anxiety and Depression Association of America (ADAA): www.adaa.org Mental Health America: www.mentalhealthamerica.net The [employer] on Mental Illness: www.nami.org Contact a health care provider if: You stop taking your antidepressant medicines, and you have any of these symptoms: Nausea. Headache. Light-headedness. Chills and body aches. Not being able to sleep (insomnia). You or your friends and family think your depression is getting worse. Get help right away if: You have thoughts of hurting yourself or others. If you ever feel like you may hurt yourself or others, or have thoughts about taking your own life, get help right away. Go to your nearest emergency department or: Call your local emergency services (911 in the [place] Call a suicide crisis helpline, such as the National Suicide Prevention Lifeline at (226)739-5472. This is open 24 hours a day in the [place] Text the Crisis Text Line at 706-035-9437 (in the [place] Summary If you are diagnosed with depression, preparing yourself to manage your symptoms is a good way to feel positive about your future. Work with your health care provider on a management plan that includes stress reduction techniques, medicines (if applicable), therapy, and healthy lifestyle habits. Keep talking with your health care provider about how your treatment is working. If  you have thoughts about taking your own life, call a suicide crisis helpline or text a crisis text line. This information is not intended to replace advice given to you by your health care provider. Make sure you discuss any questions you have with your health care provider. Document Revised: 11/26/2018 Document Reviewed: 11/26/2018 Elsevier Patient Education  2022 [employer].
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
## 2020-10-18 LAB — COMPREHENSIVE METABOLIC PANEL
ALT: 12 IU/L (ref 0–32)
AST: 13 IU/L (ref 0–40)
Albumin/Globulin Ratio: 1.6 (ref 1.2–2.2)
Albumin: 4.1 g/dL (ref 3.8–4.8)
Alkaline Phosphatase: 140 IU/L — ABNORMAL HIGH (ref 44–121)
BUN/Creatinine Ratio: 8 — ABNORMAL LOW (ref 9–23)
BUN: 7 mg/dL (ref 6–24)
Bilirubin Total: 0.2 mg/dL (ref 0.0–1.2)
CO2: 26 mmol/L (ref 20–29)
Calcium: 9.1 mg/dL (ref 8.7–10.2)
Chloride: 103 mmol/L (ref 96–106)
Creatinine, Ser: 0.86 mg/dL (ref 0.57–1.00)
Globulin, Total: 2.5 g/dL (ref 1.5–4.5)
Glucose: 138 mg/dL — ABNORMAL HIGH (ref 65–99)
Potassium: 4.9 mmol/L (ref 3.5–5.2)
Sodium: 140 mmol/L (ref 134–144)
Total Protein: 6.6 g/dL (ref 6.0–8.5)
eGFR: 84 mL/min/{1.73_m2} (ref >59)

## 2020-10-18 LAB — LIPID PANEL
Chol/HDL Ratio: 3.5 ratio (ref 0.0–4.4)
Cholesterol, Total: 124 mg/dL (ref 100–199)
HDL: 35 mg/dL — ABNORMAL LOW (ref >39)
LDL Chol Calc (NIH): 64 mg/dL (ref 0–99)
Triglycerides: 146 mg/dL (ref 0–149)
VLDL Cholesterol Cal: 25 mg/dL (ref 5–40)

## 2020-10-18 LAB — CBC WITH DIFFERENTIAL/PLATELET
Basophils Absolute: 0.1 10*3/uL (ref 0.0–0.2)
Basos: 1 %
EOS (ABSOLUTE): 0.3 10*3/uL (ref 0.0–0.4)
Eos: 5 %
Hematocrit: 43.1 % (ref 34.0–46.6)
Hemoglobin: 14.3 g/dL (ref 11.1–15.9)
Immature Grans (Abs): 0 10*3/uL (ref 0.0–0.1)
Immature Granulocytes: 0 %
Lymphocytes Absolute: 2 10*3/uL (ref 0.7–3.1)
Lymphs: 32 %
MCH: 30.8 pg (ref 26.6–33.0)
MCHC: 33.2 g/dL (ref 31.5–35.7)
MCV: 93 fL (ref 79–97)
Monocytes Absolute: 0.6 10*3/uL (ref 0.1–0.9)
Monocytes: 9 %
Neutrophils Absolute: 3.3 10*3/uL (ref 1.4–7.0)
Neutrophils: 53 %
Platelets: 335 10*3/uL (ref 150–450)
RBC: 4.65 x10E6/uL (ref 3.77–5.28)
RDW: 12.1 % (ref 11.7–15.4)
WBC: 6.2 10*3/uL (ref 3.4–10.8)

## 2020-10-18 LAB — CARDIOVASCULAR RISK ASSESSMENT

## 2020-10-18 LAB — TSH: TSH: 2.28 u[IU]/mL (ref 0.450–4.500)

## 2020-10-24 ENCOUNTER — Telehealth: Payer: Self-pay

## 2020-10-24 NOTE — Telephone Encounter (Signed)
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

## 2020-10-25 NOTE — Telephone Encounter (Signed)
Left message for patient to call office back.

## 2020-11-01 ENCOUNTER — Encounter: Payer: Self-pay | Admitting: Physical Medicine and Rehabilitation

## 2020-11-08 ENCOUNTER — Other Ambulatory Visit (HOSPITAL_BASED_OUTPATIENT_CLINIC_OR_DEPARTMENT_OTHER): Payer: Self-pay | Admitting: Nurse Practitioner

## 2020-11-08 DIAGNOSIS — Z1231 Encounter for screening mammogram for malignant neoplasm of breast: Secondary | ICD-10-CM

## 2020-11-10 ENCOUNTER — Other Ambulatory Visit: Payer: Self-pay

## 2020-11-10 DIAGNOSIS — M792 Neuralgia and neuritis, unspecified: Secondary | ICD-10-CM

## 2020-11-10 MED ORDER — ROSUVASTATIN CALCIUM 10 MG PO TABS: 10.0000 mg | ORAL_TABLET | Freq: Every day | ORAL | 0 refills | Status: DC

## 2020-11-29 ENCOUNTER — Ambulatory Visit (INDEPENDENT_AMBULATORY_CARE_PROVIDER_SITE_OTHER): Payer: 59 | Admitting: Nurse Practitioner

## 2020-11-29 ENCOUNTER — Encounter: Payer: Self-pay | Admitting: Nurse Practitioner

## 2020-11-29 ENCOUNTER — Inpatient Hospital Stay (HOSPITAL_BASED_OUTPATIENT_CLINIC_OR_DEPARTMENT_OTHER): Admission: RE | Admit: 2020-11-29 | Payer: 59 | Source: Ambulatory Visit

## 2020-11-29 VITALS — BP 134/68 | HR 91 | Temp 97.2°F | Ht 60.0 in | Wt 185.0 lb

## 2020-11-29 DIAGNOSIS — R051 Acute cough: Secondary | ICD-10-CM | POA: Diagnosis not present

## 2020-11-29 DIAGNOSIS — J018 Other acute sinusitis: Secondary | ICD-10-CM | POA: Diagnosis not present

## 2020-11-29 DIAGNOSIS — F17218 Nicotine dependence, cigarettes, with other nicotine-induced disorders: Secondary | ICD-10-CM

## 2020-11-29 DIAGNOSIS — H6501 Acute serous otitis media, right ear: Secondary | ICD-10-CM | POA: Diagnosis not present

## 2020-11-29 DIAGNOSIS — H6981 Other specified disorders of Eustachian tube, right ear: Secondary | ICD-10-CM | POA: Diagnosis not present

## 2020-11-29 DIAGNOSIS — J309 Allergic rhinitis, unspecified: Secondary | ICD-10-CM

## 2020-11-29 DIAGNOSIS — Z8619 Personal history of other infectious and parasitic diseases: Secondary | ICD-10-CM

## 2020-11-29 LAB — POC COVID19 BINAXNOW: SARS Coronavirus 2 Ag: NEGATIVE

## 2020-11-29 MED ORDER — FLUTICASONE PROPIONATE 50 MCG/ACT NA SUSP: 2.0000 | Freq: Every day | NASAL | 6 refills | Status: DC

## 2020-11-29 MED ORDER — PROMETHAZINE-DM 6.25-15 MG/5ML PO SYRP: 5.0000 mL | ORAL_SOLUTION | Freq: Four times a day (QID) | ORAL | 0 refills | Status: DC | PRN

## 2020-11-29 MED ORDER — FLUCONAZOLE 150 MG PO TABS: 150.0000 mg | ORAL_TABLET | Freq: Once | ORAL | 0 refills | Status: AC

## 2020-11-29 MED ORDER — AZITHROMYCIN 250 MG PO TABS: ORAL_TABLET | ORAL | 0 refills | Status: AC

## 2020-11-29 MED ORDER — TRIAMCINOLONE ACETONIDE 40 MG/ML IJ SUSP
60.0000 mg | Freq: Once | INTRAMUSCULAR | Status: AC
  Administered 2020-11-29: 60 mg via INTRAMUSCULAR

## 2020-11-29 NOTE — Progress Notes (Signed)
Acute Office Visit  Subjective:    Patient ID: Theresa Bowman, female    DOB: [birth year], 50 y.o.   MRN: [id number]  CC: cough  HPI:  Theresa Bowman is a 50 year old Caucasian female that presents for evaluation of cough, sinus congestion, post-nasal-drip, and right ear pain. Onset of symptoms was 4-days-ago. Treatment has included pushing fluids. She tells me that she lost her mother 4-weeks ago. States she may have been exposed to ill-contacts unknowingly. She has been living with a family friend since her mother's demise due to difficult family issues. She has asthma and is currently smoking. She has obtained COVID-19 vaccines x 2. She has declined flu vaccine recently.   Past Medical History:  Diagnosis Date   ADHD    Arthritis    Asthma    Depression    Diverticulitis    Hyperlipidemia    Thyroid disease     Past Surgical History:  Procedure Laterality Date   ABDOMINAL HYSTERECTOMY     2004   APPENDECTOMY     age 74, kindergarten   BACK SURGERY      Family History  Problem Relation Age of Onset   Cancer Mother    Thyroid disease Mother    Kidney disease Father    Hypertension Sister     Social History   Socioeconomic History   Marital status: Divorced    Spouse name: Not on file   Number of children: 1   Years of education: 8th grade   Highest education level: Not on file  Occupational History   Not on file  Tobacco Use   Smoking status: Some Days    Types: Cigarettes   Smokeless tobacco: Never  Substance and Sexual Activity   Alcohol use: Yes    Alcohol/week: 2.0 standard drinks    Types: 1 Glasses of wine, 1 Cans of beer per week   Drug use: Yes    Types: Marijuana, Benzodiazepines    Comment: pt has history of opiate addiction   Sexual activity: Yes  Other Topics Concern   Not on file  Social History Narrative   Not on file   Social Determinants of Health   Financial Resource Strain: Not on file  Food Insecurity: Not on file  Transportation  Needs: No Transportation Needs   Lack of Transportation (Medical): No   Lack of Transportation (Non-Medical): No  Physical Activity: Not on file  Stress: Not on file  Social Connections: Not on file  Intimate Partner Violence: Not At Risk   Fear of Current or Ex-Partner: No   Emotionally Abused: No   Physically Abused: No   Sexually Abused: No    Outpatient Medications Prior to Visit  Medication Sig Dispense Refill   albuterol (VENTOLIN HFA) 108 (90 Base) MCG/ACT inhaler Inhale into the lungs.     citalopram (CELEXA) 20 MG tablet Take 1 tablet (20 mg total) by mouth daily. 30 tablet 3   gabapentin (NEURONTIN) 300 MG capsule Take 1 capsule (300 mg total) by mouth 3 (three) times daily. 90 capsule 3   rosuvastatin (CRESTOR) 10 MG tablet Take 1 tablet (10 mg total) by mouth daily. 90 tablet 0   Vitamin D, Ergocalciferol, (DRISDOL) 1.25 MG (50000 UNIT) CAPS capsule Take 1 capsule (50,000 Units total) by mouth 2 (two) times a week. 24 capsule 0   No facility-administered medications prior to visit.    Allergies  Allergen Reactions   Codeine Itching   Hydrocodone-Acetaminophen Itching   Sulfamethoxazole Itching  Sulfonamide Derivatives    Bupropion Hives and Itching   Sulfa Antibiotics Itching    Review of Systems  Constitutional:  Positive for fatigue. Negative for chills and fever.  HENT:  Positive for congestion, ear pain (Right), hearing loss (left ear), postnasal drip, rhinorrhea, sinus pressure, sinus pain, sore throat and voice change.   Eyes: Negative.   Respiratory:  Positive for cough. Negative for shortness of breath.   Cardiovascular:  Negative for chest pain.  Gastrointestinal: Negative.  Negative for diarrhea and nausea.  Endocrine: Negative.   Genitourinary: Negative.   Musculoskeletal: Negative.   Skin: Negative.   Allergic/Immunologic: Positive for environmental allergies.  Neurological:  Positive for headaches. Negative for dizziness.  Hematological:   Negative for adenopathy.  Psychiatric/Behavioral: Negative.        Objective:    Physical Exam Vitals reviewed.  Constitutional:      Appearance: She is ill-appearing.  HENT:     Head: Normocephalic.     Right Ear: Tenderness present. A middle ear effusion is present. Tympanic membrane is injected and erythematous.     Left Ear: Decreased hearing noted. Tympanic membrane is scarred.     Nose: Congestion and rhinorrhea present.     Right Sinus: Maxillary sinus tenderness present.     Left Sinus: Maxillary sinus tenderness present.     Mouth/Throat:     Mouth: Mucous membranes are moist.     Pharynx: Posterior oropharyngeal erythema present.  Cardiovascular:     Rate and Rhythm: Normal rate and regular rhythm.     Pulses: Normal pulses.     Heart sounds: Normal heart sounds.  Pulmonary:     Effort: Pulmonary effort is normal.     Breath sounds: Normal breath sounds.  Abdominal:     General: Bowel sounds are normal.     Palpations: Abdomen is soft.  Musculoskeletal:        General: Normal range of motion.     Cervical back: Neck supple.  Skin:    General: Skin is warm and dry.     Capillary Refill: Capillary refill takes less than 2 seconds.  Neurological:     General: No focal deficit present.     Mental Status: She is alert and oriented to person, place, and time.  Psychiatric:        Mood and Affect: Mood normal.        Behavior: Behavior normal.    BP 134/68   Pulse 91   Temp (!) 97.2 F (36.2 C)   Ht 5' (1.524 m)   Wt 185 lb (83.9 kg)   SpO2 96%   BMI 36.13 kg/m   Wt Readings from Last 3 Encounters:  10/17/20 195 lb (88.5 kg)  07/13/20 200 lb (90.7 kg)    Health Maintenance Due  Topic Date Due   COVID-19 Vaccine (1) Never done   Pneumococcal Vaccine 25-39 Years old (1 - PCV) Never done   HIV Screening  Never done   TETANUS/TDAP  Never done   PAP SMEAR-Modifier  Never done   COLONOSCOPY (Pts 45-15yr Insurance coverage will need to be confirmed)   Never done   INFLUENZA VACCINE  Never done       Lab Results  Component Value Date   TSH 2.280 10/17/2020   Lab Results  Component Value Date   WBC 6.2 10/17/2020   HGB 14.3 10/17/2020   HCT 43.1 10/17/2020   MCV 93 10/17/2020   PLT 335 10/17/2020   Lab Results  Component Value Date   NA 140 10/17/2020   K 4.9 10/17/2020   CO2 26 10/17/2020   GLUCOSE 138 (H) 10/17/2020   BUN 7 10/17/2020   CREATININE 0.86 10/17/2020   BILITOT 0.2 10/17/2020   ALKPHOS 140 (H) 10/17/2020   AST 13 10/17/2020   ALT 12 10/17/2020   PROT 6.6 10/17/2020   ALBUMIN 4.1 10/17/2020   CALCIUM 9.1 10/17/2020   EGFR 84 10/17/2020   Lab Results  Component Value Date   CHOL 124 10/17/2020   Lab Results  Component Value Date   HDL 35 (L) 10/17/2020   Lab Results  Component Value Date   LDLCALC 64 10/17/2020   Lab Results  Component Value Date   TRIG 146 10/17/2020   Lab Results  Component Value Date   CHOLHDL 3.5 10/17/2020   Lab Results  Component Value Date   HGBA1C 6.3 (H) 07/15/2020       Assessment & Plan:   1. Dysfunction of right eustachian tube - triamcinolone acetonide (KENALOG-40) injection 60 mg  2. Non-recurrent acute serous otitis media of right ear - azithromycin (ZITHROMAX) 250 MG tablet; Take 2 tablets on day 1, then 1 tablet daily on days 2 through 5  Dispense: 6 tablet; Refill: 0  3. Acute non-recurrent sinusitis of other sinus - fluticasone (FLONASE) 50 MCG/ACT nasal spray; Place 2 sprays into both nostrils daily.  Dispense: 16 g; Refill: 6 - azithromycin (ZITHROMAX) 250 MG tablet; Take 2 tablets on day 1, then 1 tablet daily on days 2 through 5  Dispense: 6 tablet; Refill: 0  4. Acute cough - POC COVID-19 BinaxNow - promethazine-dextromethorphan (PROMETHAZINE-DM) 6.25-15 MG/5ML syrup; Take 5 mLs by mouth 4 (four) times daily as needed.  Dispense: 118 mL; Refill: 0  5. Chronic allergic rhinitis - fluticasone (FLONASE) 50 MCG/ACT nasal spray; Place 2  sprays into both nostrils daily.  Dispense: 16 g; Refill: 6  6. History of candidiasis of vagina - fluconazole (DIFLUCAN) 150 MG tablet; Take 1 tablet (150 mg total) by mouth once for 1 dose.  Dispense: 1 tablet; Refill: 0  7. Cigarette nicotine dependence with other nicotine-induced disorder    Kenalog 60 mg injection given in office Take Z-pack as directed Use Flonase nasal spray daily Take cough syrup Promethazine-DM up to 4 times daily as needed for cough Rest and push fluids Follow-up as needed  Orders Placed This Encounter  Procedures   POC COVID-19 BinaxNow     Follow-up: PRN  An After Visit Summary was printed and given to the patient.  I, Rip Harbour, NP, have reviewed all documentation for this visit. The documentation on 11/29/20 for the exam, diagnosis, procedures, and orders are all accurate and complete.    Signed, Rip Harbour, NP [place] [phone]

## 2020-11-29 NOTE — Patient Instructions (Addendum)
Kenalog 60 mg injection given in office Take Z-pack as directed Use Flonase nasal spray daily Take cough syrup Promethazine-DM up to 4 times daily as needed for cough Rest and push fluids Follow-up as needed      Otitis Media, Adult Otitis media is a condition in which the middle ear is red and swollen (inflamed) and full of fluid. The middle ear is the part of the ear that contains bones for hearing as well as air that helps send sounds to the brain. The condition usually goes away on its own. What are the causes? This condition is caused by a blockage in the eustachian tube. This tube connects the middle ear to the back of the nose. It normally allows air into the middle ear. The blockage is caused by fluid or swelling. Problems that can cause blockage include: A cold or infection that affects the nose, mouth, or throat. Allergies. An irritant, such as tobacco smoke. Adenoids that have become large. The adenoids are soft tissue located in the back of the throat, behind the nose and the roof of the mouth. Growth or swelling in the upper part of the throat, just behind the nose (nasopharynx). Damage to the ear caused by a change in pressure. This is called barotrauma. What increases the risk? You are more likely to develop this condition if you: Smoke or are exposed to tobacco smoke. Have an opening in the roof of your mouth (cleft palate). Have acid reflux. Have problems in your body's defense system (immune system). What are the signs or symptoms? Symptoms of this condition include: Ear pain. Fever. Problems with hearing. Being tired. Fluid leaking from the ear. Ringing in the ear. How is this treated? This condition can go away on its own within 3-5 days. But if the condition is caused by germs (bacteria) and does not go away on its own, or if it keeps coming back, your doctor may: Give you antibiotic medicines. Give you medicines for pain. Follow these instructions at  home: Take over-the-counter and prescription medicines only as told by your doctor. If you were prescribed an antibiotic medicine, take it as told by your doctor. Do not stop taking it even if you start to feel better. Keep all follow-up visits. Contact a doctor if: You have bleeding from your nose. There is a lump on your neck. You are not feeling better in 5 days. You feel worse instead of better. Get help right away if: You have pain that is not helped with medicine. You have swelling, redness, or pain around your ear. You get a stiff neck. You cannot move part of your face (paralysis). You notice that the bone behind your ear hurts when you touch it. You get a very bad headache. Summary Otitis media means that the middle ear is red, swollen, and full of fluid. This condition usually goes away on its own. If the problem does not go away, treatment may be needed. You may be given medicines to treat the infection or to treat your pain. If you were prescribed an antibiotic medicine, take it as told by your doctor. Do not stop taking it even if you start to feel better. Keep all follow-up visits. This information is not intended to replace advice given to you by your health care provider. Make sure you discuss any questions you have with your health care provider. Document Revised: 04/25/2020 Document Reviewed: 04/25/2020 Elsevier Patient Education  2022 [employer]    Eustachian Tube Dysfunction Eustachian  tube dysfunction refers to a condition in which a blockage develops in the narrow passage that connects the middle ear to the back of the nose (eustachian tube). The eustachian tube regulates air pressure in the middle ear by letting air move between the ear and nose. It also helps to drain fluid from the middle ear space. Eustachian tube dysfunction can affect one or both ears. When the eustachian tube does not function properly, air pressure, fluid, or both can build up in the  middle ear. What are the causes? This condition occurs when the eustachian tube becomes blocked or cannot open normally. Common causes of this condition include: Ear infections. Colds and other infections that affect the nose, mouth, and throat (upper respiratory tract). Allergies. Irritation from cigarette smoke. Irritation from stomach acid coming up into the esophagus (gastroesophageal reflux). The esophagus is the part of the body that moves food from the mouth to the stomach. Sudden changes in air pressure, such as from descending in an airplane or scuba diving. Abnormal growths in the nose or throat, such as: Growths that line the nose (nasal polyps). Abnormal growth of cells (tumors). Enlarged tissue at the back of the throat (adenoids). What increases the risk? You are more likely to develop this condition if: You smoke. You are overweight. You are a child who has: Certain birth defects of the mouth, such as cleft palate. Large tonsils or adenoids. What are the signs or symptoms? Common symptoms of this condition include: A feeling of fullness in the ear. Ear pain. Clicking or popping noises in the ear. Ringing in the ear (tinnitus). Hearing loss. Loss of balance. Dizziness. Symptoms may get worse when the air pressure around you changes, such as when you travel to an area of high elevation, fly on an airplane, or go scuba diving. How is this diagnosed? This condition may be diagnosed based on: Your symptoms. A physical exam of your ears, nose, and throat. Tests, such as those that measure: The movement of your eardrum. Your hearing (audiometry). How is this treated? Treatment depends on the cause and severity of your condition. In mild cases, you may relieve your symptoms by moving air into your ears. This is called "popping the ears." In more severe cases, or if you have symptoms of fluid in your ears, treatment may include: Medicines to relieve congestion  (decongestants). Medicines that treat allergies (antihistamines). Nasal sprays or ear drops that contain medicines that reduce swelling (steroids). A procedure to drain the fluid in your eardrum. In this procedure, a small tube may be placed in the eardrum to: Drain the fluid. Restore the air in the middle ear space. A procedure to insert a balloon device through the nose to inflate the opening of the eustachian tube (balloon dilation). Follow these instructions at home: Lifestyle Do not do any of the following until your health care provider approves: Travel to high altitudes. Fly in airplanes. Work in a Estate agent or room. Scuba dive. Do not use any products that contain nicotine or tobacco. These products include cigarettes, chewing tobacco, and vaping devices, such as e-cigarettes. If you need help quitting, ask your health care provider. Keep your ears dry. Wear fitted earplugs during showering and bathing. Dry your ears completely after. General instructions Take over-the-counter and prescription medicines only as told by your health care provider. Use techniques to help pop your ears as recommended by your health care provider. These may include: Chewing gum. Yawning. Frequent, forceful swallowing. Closing your mouth, holding  your nose closed, and gently blowing as if you are trying to blow air out of your nose. Keep all follow-up visits. This is important. Contact a health care provider if: Your symptoms do not go away after treatment. Your symptoms come back after treatment. You are unable to pop your ears. You have: A fever. Pain in your ear. Pain in your head or neck. Fluid draining from your ear. Your hearing suddenly changes. You become very dizzy. You lose your balance. Get help right away if: You have a sudden, severe increase in any of your symptoms. Summary Eustachian tube dysfunction refers to a condition in which a blockage develops in the eustachian  tube. It can be caused by ear infections, allergies, inhaled irritants, or abnormal growths in the nose or throat. Symptoms may include ear pain or fullness, hearing loss, or ringing in the ears. Mild cases are treated with techniques to unblock the ears, such as yawning or chewing gum. More severe cases are treated with medicines or procedures. This information is not intended to replace advice given to you by your health care provider. Make sure you discuss any questions you have with your health care provider. Document Revised: 03/28/2020 Document Reviewed: 03/28/2020 Elsevier Patient Education  2022 [employer] Sinusitis, Adult Sinusitis is soreness and swelling (inflammation) of your sinuses. Sinuses are hollow spaces in the bones around your face. They are located: Around your eyes. In the middle of your forehead. Behind your nose. In your cheekbones. Your sinuses and nasal passages are lined with a fluid called mucus. Mucus drains out of your sinuses. Swelling can trap mucus in your sinuses. This lets germs (bacteria, virus, or fungus) grow, which leads to infection. Most of the time, this condition is caused by a virus. What are the causes? This condition is caused by: Allergies. Asthma. Germs. Things that block your nose or sinuses. Growths in the nose (nasal polyps). Chemicals or irritants in the air. Fungus (rare). What increases the risk? You are more likely to develop this condition if: You have a weak body defense system (immune system). You do a lot of swimming or diving. You use nasal sprays too much. You smoke. What are the signs or symptoms? The main symptoms of this condition are pain and a feeling of pressure around the sinuses. Other symptoms include: Stuffy nose (congestion). Runny nose (drainage). Swelling and warmth in the sinuses. Headache. Toothache. A cough that may get worse at night. Mucus that collects in the throat or the back of the nose  (postnasal drip). Being unable to smell and taste. Being very tired (fatigue). A fever. Sore throat. Bad breath. How is this diagnosed? This condition is diagnosed based on: Your symptoms. Your medical history. A physical exam. Tests to find out if your condition is short-term (acute) or long-term (chronic). Your doctor may: Check your nose for growths (polyps). Check your sinuses using a tool that has a light (endoscope). Check for allergies or germs. Do imaging tests, such as an MRI or CT scan. How is this treated? Treatment for this condition depends on the cause and whether it is short-term or long-term. If caused by a virus, your symptoms should go away on their own within 10 days. You may be given medicines to relieve symptoms. They include: Medicines that shrink swollen tissue in the nose. Medicines that treat allergies (antihistamines). A spray that treats swelling of the nostrils.  Rinses that help get rid of thick mucus in your nose (nasal saline washes). If  caused by bacteria, your doctor may wait to see if you will get better without treatment. You may be given antibiotic medicine if you have: A very bad infection. A weak body defense system. If caused by growths in the nose, you may need to have surgery. Follow these instructions at home: Medicines Take, use, or apply over-the-counter and prescription medicines only as told by your doctor. These may include nasal sprays. If you were prescribed an antibiotic medicine, take it as told by your doctor. Do not stop taking the antibiotic even if you start to feel better. Hydrate and humidify  Drink enough water to keep your pee (urine) pale yellow. Use a cool mist humidifier to keep the humidity level in your home above 50%. Breathe in steam for 10-15 minutes, 3-4 times a day, or as told by your doctor. You can do this in the bathroom while a hot shower is running. Try not to spend time in cool or dry air. Rest Rest as  much as you can. Sleep with your head raised (elevated). Make sure you get enough sleep each night. General instructions  Put a warm, moist washcloth on your face 3-4 times a day, or as often as told by your doctor. This will help with discomfort. Wash your hands often with soap and water. If there is no soap and water, use hand sanitizer. Do not smoke. Avoid being around people who are smoking (secondhand smoke). Keep all follow-up visits as told by your doctor. This is important. Contact a doctor if: You have a fever. Your symptoms get worse. Your symptoms do not get better within 10 days. Get help right away if: You have a very bad headache. You cannot stop throwing up (vomiting). You have very bad pain or swelling around your face or eyes. You have trouble seeing. You feel confused. Your neck is stiff. You have trouble breathing. Summary Sinusitis is swelling of your sinuses. Sinuses are hollow spaces in the bones around your face. This condition is caused by tissues in your nose that become inflamed or swollen. This traps germs. These can lead to infection. If you were prescribed an antibiotic medicine, take it as told by your doctor. Do not stop taking it even if you start to feel better. Keep all follow-up visits as told by your doctor. This is important. This information is not intended to replace advice given to you by your health care provider. Make sure you discuss any questions you have with your health care provider. Document Revised: 06/17/2017 Document Reviewed: 06/17/2017 Elsevier Patient Education  2022 [employer].

## 2021-02-13 ENCOUNTER — Other Ambulatory Visit: Payer: Self-pay

## 2021-02-13 ENCOUNTER — Ambulatory Visit (INDEPENDENT_AMBULATORY_CARE_PROVIDER_SITE_OTHER): Payer: 59

## 2021-02-13 DIAGNOSIS — Z111 Encounter for screening for respiratory tuberculosis: Secondary | ICD-10-CM

## 2021-02-15 ENCOUNTER — Ambulatory Visit: Payer: 59

## 2021-02-15 ENCOUNTER — Other Ambulatory Visit: Payer: Self-pay

## 2021-02-15 LAB — TB SKIN TEST
Induration: 0 mm
TB Skin Test: NEGATIVE

## 2021-02-16 ENCOUNTER — Encounter: Payer: 59 | Attending: Physical Medicine and Rehabilitation | Admitting: Physical Medicine and Rehabilitation

## 2021-02-25 ENCOUNTER — Other Ambulatory Visit: Payer: Self-pay | Admitting: Nurse Practitioner

## 2021-02-25 DIAGNOSIS — M792 Neuralgia and neuritis, unspecified: Secondary | ICD-10-CM

## 2021-02-26 ENCOUNTER — Other Ambulatory Visit: Payer: Self-pay | Admitting: Nurse Practitioner

## 2021-04-07 ENCOUNTER — Other Ambulatory Visit: Payer: Self-pay | Admitting: Nurse Practitioner

## 2021-04-07 DIAGNOSIS — M792 Neuralgia and neuritis, unspecified: Secondary | ICD-10-CM

## 2021-04-13 ENCOUNTER — Ambulatory Visit: Payer: 59 | Admitting: Nurse Practitioner

## 2021-04-18 ENCOUNTER — Encounter: Payer: Self-pay | Admitting: Nurse Practitioner

## 2021-04-18 ENCOUNTER — Ambulatory Visit (INDEPENDENT_AMBULATORY_CARE_PROVIDER_SITE_OTHER): Payer: 59 | Admitting: Nurse Practitioner

## 2021-04-18 ENCOUNTER — Other Ambulatory Visit: Payer: Self-pay | Admitting: Nurse Practitioner

## 2021-04-18 VITALS — BP 118/80 | HR 85 | Temp 98.0°F | Ht 60.0 in | Wt 188.6 lb

## 2021-04-18 DIAGNOSIS — Z6836 Body mass index (BMI) 36.0-36.9, adult: Secondary | ICD-10-CM | POA: Diagnosis not present

## 2021-04-18 DIAGNOSIS — I10 Essential (primary) hypertension: Secondary | ICD-10-CM

## 2021-04-18 DIAGNOSIS — F411 Generalized anxiety disorder: Secondary | ICD-10-CM

## 2021-04-18 DIAGNOSIS — K219 Gastro-esophageal reflux disease without esophagitis: Secondary | ICD-10-CM

## 2021-04-18 DIAGNOSIS — M792 Neuralgia and neuritis, unspecified: Secondary | ICD-10-CM

## 2021-04-18 DIAGNOSIS — E782 Mixed hyperlipidemia: Secondary | ICD-10-CM | POA: Diagnosis not present

## 2021-04-18 DIAGNOSIS — F339 Major depressive disorder, recurrent, unspecified: Secondary | ICD-10-CM

## 2021-04-18 DIAGNOSIS — Z1211 Encounter for screening for malignant neoplasm of colon: Secondary | ICD-10-CM

## 2021-04-18 DIAGNOSIS — F17218 Nicotine dependence, cigarettes, with other nicotine-induced disorders: Secondary | ICD-10-CM

## 2021-04-18 DIAGNOSIS — Z1231 Encounter for screening mammogram for malignant neoplasm of breast: Secondary | ICD-10-CM

## 2021-04-18 MED ORDER — CITALOPRAM HYDROBROMIDE 10 MG PO TABS: 10.0000 mg | ORAL_TABLET | Freq: Every day | ORAL | 3 refills | Status: DC

## 2021-04-18 MED ORDER — OMEPRAZOLE 20 MG PO CPDR: 20.0000 mg | DELAYED_RELEASE_CAPSULE | Freq: Every day | ORAL | 3 refills | Status: AC

## 2021-04-18 MED ORDER — GABAPENTIN 300 MG PO CAPS: 300.0000 mg | ORAL_CAPSULE | Freq: Three times a day (TID) | ORAL | 0 refills | Status: DC

## 2021-04-18 NOTE — Progress Notes (Signed)
? ?Subjective:  ?Patient ID: Theresa Bowman, female    DOB: 1970/08/22  Age: 51 y.o. MRN: [id number] ? ?Chief Complaint  ?Patient presents with  ? Depression  ? ? ?HPI: ?Theresa Bowman is a 51 year old Caucasian female that presents for follow-up of depression, anxiety, and chronic pain. She has a history of hypertension, hyperlipidemia, and nicotine dependence with current smoking. States she has discontinue antihypertensives and statin medication due to disagreeing with need. States she continues to grieve the loss of her mother who passed in October 2022. She denies going to grief counseling or therapy. She has requested Xanax prescription to help with depression and anxiety.   ? ?Theresa Bowman tells me her right great toe is painful, bruised, and swollen from hitting it on a door. States she is able to weight bear. ? ? ?Depression, Follow-up ? ?She  was last seen for this 6 months ago. ?Currently not taking any medications ?  ?She reports poor compliance with treatment. ?She is not having side effects.  ? ?She reports poor tolerance of treatment. ?Current symptoms include: depressed mood, fatigue, and insomnia ?She feels she is Worse since last visit. ? ?Depression screen [facility] 2/9 04/18/2021 10/17/2020  ?Decreased Interest 1 3  ?Down, Depressed, Hopeless 3 0  ?PHQ - 2 Score 4 3  ?Altered sleeping 3 2  ?Tired, decreased energy 1 2  ?Change in appetite 3 2  ?Feeling bad or failure about yourself  3 3  ?Trouble concentrating 3 2  ?Moving slowly or fidgety/restless 3 3  ?Suicidal thoughts 0 0  ?PHQ-9 Score 20 17  ?Difficult doing work/chores - Extremely dIfficult  ?   ? ?She  was last seen for this 6 months ago. ? ? ?She reports poor tolerance of treatment. ?Current symptoms include: difficulty concentrating, fatigue, and insomnia ?She feels she is Worse since last visit. ? ? Anxiety, Follow-up ? ?She was last seen for anxiety 6 months ago. ?Current treatment includes marijuana ?  ?She reports excellent compliance with treatment. ?She  reports poor tolerance of treatment. ?She is not having side effects.  ? ?She feels her anxiety is severe and Worse since last visit. ? ?Symptoms: ?No chest pain Yes difficulty concentrating  ?No dizziness Yes fatigue  ?No feelings of losing control Yes insomnia  ?Yes irritable No palpitations  ?No panic attacks No racing thoughts  ?No shortness of breath No sweating  ?No tremors/shakes   ? ?GAD-7 Results ?GAD-7 Generalized Anxiety Disorder Screening Tool 04/18/2021 10/17/2020  ?1. Feeling Nervous, Anxious, or on Edge 3 3  ?2. Not Being Able to Stop or Control Worrying 3 3  ?3. Worrying Too Much About Different Things 3 3  ?4. Trouble Relaxing 3 3  ?5. Being So Restless it's Hard To Sit Still 2 1  ?6. Becoming Easily Annoyed or Irritable 3 3  ?7. Feeling Afraid As If Something Awful Might Happen 3 3  ?Total GAD-7 Score 20 19  ?Difficulty At Work, Home, or Getting  Along With Others? - Somewhat difficult  ? ? ?PHQ-9 Scores ?PHQ9 SCORE ONLY 04/18/2021 10/17/2020  ?PHQ-9 Total Score 20 17  ?  ? ? ?Current Outpatient Medications on File Prior to Visit  ?Medication Sig Dispense Refill  ? albuterol (VENTOLIN HFA) 108 (90 Base) MCG/ACT inhaler Inhale into the lungs.    ? fluconazole (DIFLUCAN) 150 MG tablet Take 150 mg by mouth once.    ? fluticasone (FLONASE) 50 MCG/ACT nasal spray Place 2 sprays into both nostrils daily. 16 g 6  ?  gabapentin (NEURONTIN) 300 MG capsule TAKE 1 CAPSULE BY MOUTH 3 TIMES DAILY 90 capsule 0  ? promethazine-dextromethorphan (PROMETHAZINE-DM) 6.25-15 MG/5ML syrup Take 5 mLs by mouth 4 (four) times daily as needed. 118 mL 0  ? rosuvastatin (CRESTOR) 10 MG tablet Take 1 tablet (10 mg total) by mouth daily. 90 tablet 0  ? Vitamin D, Ergocalciferol, (DRISDOL) 1.25 MG (50000 UNIT) CAPS capsule Take 1 capsule (50,000 Units total) by mouth 2 (two) times a week. 24 capsule 0  ? ?No current facility-administered medications on file prior to visit.  ? ?Past Medical History:  ?Diagnosis Date  ? ADHD   ?  Arthritis   ? Asthma   ? Depression   ? Diverticulitis   ? Hyperlipidemia   ? Thyroid disease   ? ?Past Surgical History:  ?Procedure Laterality Date  ? ABDOMINAL HYSTERECTOMY    ? 2004  ? APPENDECTOMY    ? age 89, kindergarten  ? BACK SURGERY    ?  ?Family History  ?Problem Relation Age of Onset  ? Cancer Mother   ?     Lymphoma  ? Thyroid disease Mother   ? Kidney disease Father   ? Hypertension Sister   ? ?Social History  ? ?Socioeconomic History  ? Marital status: Divorced  ?  Spouse name: Not on file  ? Number of children: 1  ? Years of education: 8th grade  ? Highest education level: Not on file  ?Occupational History  ? Not on file  ?Tobacco Use  ? Smoking status: Some Days  ?  Types: Cigarettes  ? Smokeless tobacco: Never  ?Substance and Sexual Activity  ? Alcohol use: Yes  ?  Alcohol/week: 2.0 standard drinks  ?  Types: 1 Glasses of wine, 1 Cans of beer per week  ? Drug use: Yes  ?  Types: Marijuana, Benzodiazepines  ?  Comment: pt has history of opiate addiction  ? Sexual activity: Yes  ?Other Topics Concern  ? Not on file  ?Social History Narrative  ? Not on file  ? ?Social Determinants of Health  ? ?Financial Resource Strain: Not on file  ?Food Insecurity: Not on file  ?Transportation Needs: No Transportation Needs  ? Lack of Transportation (Medical): No  ? Lack of Transportation (Non-Medical): No  ?Physical Activity: Not on file  ?Stress: Not on file  ?Social Connections: Not on file  ? ? ?Review of Systems  ?Constitutional:  Negative for appetite change, chills, fatigue and fever.  ?HENT:  Negative for congestion, ear discharge, ear pain, rhinorrhea, sinus pressure, sneezing and sore throat.   ?Eyes:  Positive for visual disturbance.  ?Respiratory:  Negative for cough, chest tightness, shortness of breath and wheezing.   ?Cardiovascular:  Negative for chest pain, palpitations and leg swelling.  ?Gastrointestinal:  Negative for abdominal pain, diarrhea, nausea and vomiting.  ?     GERD  ?Endocrine:  Negative for polydipsia, polyphagia and polyuria.  ?Genitourinary:  Negative for difficulty urinating, dysuria, frequency, hematuria, menstrual problem, urgency, vaginal bleeding, vaginal discharge and vaginal pain.  ?Musculoskeletal:  Positive for arthralgias (right great toe). Negative for back pain, gait problem, joint swelling, myalgias and neck pain.  ?Skin: Negative.   ?Neurological:  Negative for dizziness, seizures, syncope, weakness, numbness and headaches.  ?Psychiatric/Behavioral:  Positive for depression and sleep disturbance. Negative for agitation, confusion, hallucinations and suicidal ideas. The patient is nervous/anxious.   ? ? ?Objective:  ?BP 118/80   Pulse 85   Temp 98 ?F (36.7 ?C)  Ht 5' (1.524 m)   Wt 188 lb 9.6 oz (85.5 kg)   SpO2 97%   BMI 36.83 kg/m?   ? ?BP/Weight 11/29/2020 10/17/2020 07/13/2020  ?Systolic BP 134 130 124  ?Diastolic BP 68 78 80  ?Wt. (Lbs) 185 195 200  ?BMI 36.13 38.08 39.06  ? ? ?Physical Exam ?Vitals reviewed.  ?Constitutional:   ?   Appearance: Normal appearance.  ?HENT:  ?   Head: Normocephalic.  ?   Right Ear: Tympanic membrane normal.  ?   Left Ear: Tympanic membrane normal.  ?   Nose: Nose normal.  ?   Mouth/Throat:  ?   Mouth: Mucous membranes are moist.  ?Eyes:  ?   Pupils: Pupils are equal, round, and reactive to light.  ?Cardiovascular:  ?   Rate and Rhythm: Normal rate and regular rhythm.  ?   Pulses: Normal pulses.  ?   Heart sounds: Normal heart sounds.  ?Pulmonary:  ?   Effort: Pulmonary effort is normal.  ?   Breath sounds: Wheezing (inspiratory) present.  ?Abdominal:  ?   General: Bowel sounds are normal.  ?   Palpations: Abdomen is soft.  ?Musculoskeletal:     ?   General: Swelling (right great toe) and tenderness (right great toe/foot) present.  ?   Cervical back: Neck supple.  ?Skin: ?   General: Skin is warm and dry.  ?   Capillary Refill: Capillary refill takes less than 2 seconds.  ?   Findings: Bruising (right anterior forefoot) present.   ?Neurological:  ?   General: No focal deficit present.  ?   Mental Status: She is alert and oriented to person, place, and time.  ?Psychiatric:     ?   Mood and Affect: Mood normal.     ?   Behavior: Behavior normal.  ? ?

## 2021-04-18 NOTE — Patient Instructions (Addendum)
Begin Citalopram 10 mg daily  ?Begin Omeprazole20 mg daily ?Avoid foods that trigger GERD ?Set a quit date for smoking ?Notify office of any side effects ?We will call you with lab results ?Follow-up in 4-weeks for anxiety medication ? ? ?Food Choices for Gastroesophageal Reflux Disease, Adult ?When you have gastroesophageal reflux disease (GERD), the foods you eat and your eating habits are very important. Choosing the right foods can help ease your discomfort. Think about working with a food expert (dietitian) to help you make good choices. ?What are tips for following this plan? ?Reading food labels ?Look for foods that are low in saturated fat. Foods that may help with your symptoms include: ?Foods that have less than 5% of daily value (DV) of fat. ?Foods that have 0 grams of trans fat. ?Cooking ?Do not fry your food. ?Cook your food by baking, steaming, grilling, or broiling. These are all methods that do not need a lot of fat for cooking. ?To add flavor, try to use herbs that are low in spice and acidity. ?Meal planning ? ?Choose healthy foods that are low in fat, such as: ?Fruits and vegetables. ?Whole grains. ?Low-fat dairy products. ?Lean meats, fish, and poultry. ?Eat small meals often instead of eating 3 large meals each day. Eat your meals slowly in a place where you are relaxed. Avoid bending over or lying down until 2-3 hours after eating. ?Limit high-fat foods such as fatty meats or fried foods. ?Limit your intake of fatty foods, such as oils, butter, and shortening. ?Avoid the following as told by your doctor: ?Foods that cause symptoms. These may be different for different people. Keep a food diary to keep track of foods that cause symptoms. ?Alcohol. ?Drinking a lot of liquid with meals. ?Eating meals during the 2-3 hours before bed. ?Lifestyle ?Stay at a healthy weight. Ask your doctor what weight is healthy for you. If you need to lose weight, work with your doctor to do so safely. ?Exercise for  at least 30 minutes on 5 or more days each week, or as told by your doctor. ?Wear loose-fitting clothes. ?Do not smoke or use any products that contain nicotine or tobacco. If you need help quitting, ask your doctor. ?Sleep with the head of your bed higher than your feet. Use a wedge under the mattress or blocks under the bed frame to raise the head of the bed. ?Chew sugar-free gum after meals. ?What foods should eat? ?Eat a healthy, well-balanced diet of fruits, vegetables, whole grains, low-fat dairy products, lean meats, fish, and poultry. Each person is different. ?Foods that may cause symptoms in one person may not cause any symptoms in another person. Work with your doctor to find foods that are safe for you. ?The items listed above may not be a complete list of what you can eat and drink. Contact a food expert for more options. ?What foods should I avoid? ?Limiting some of these foods may help in managing the symptoms of GERD. Everyone is different. Talk with a food expert or your doctor to help you find the exact foods to avoid, if any. ?Fruits ?Any fruits prepared with added fat. Any fruits that cause symptoms. For some people, this may include citrus fruits, such as oranges, grapefruit, pineapple, and lemons. ?Vegetables ?Deep-fried vegetables. [place] fries. Any vegetables prepared with added fat. Any vegetables that cause symptoms. For some people, this may include tomatoes and tomato products, chili peppers, onions and garlic, and horseradish. ?Grains ?Pastries or quick breads with  added fat. ?Meats and other proteins ?High-fat meats, such as fatty beef or pork, hot dogs, ribs, ham, sausage, salami, and bacon. Fried meat or protein, including fried fish and fried chicken. Nuts and nut butters, in large amounts. ?Dairy ?Whole milk and chocolate milk. Sour cream. Cream. Ice cream. Cream cheese. Milkshakes. ?Fats and oils ?Butter. Margarine. Shortening. Ghee. ?Beverages ?Coffee and tea, with or without  caffeine. Carbonated beverages. Sodas. Energy drinks. Fruit juice made with acidic fruits, such as orange or grapefruit. Tomato juice. Alcoholic drinks. ?Sweets and desserts ?Chocolate and cocoa. Donuts. ?Seasonings and condiments ?Pepper. Peppermint and spearmint. Added salt. Any condiments, herbs, or seasonings that cause symptoms. For some people, this may include curry, hot sauce, or vinegar-based salad dressings. ?The items listed above may not be a complete list of what you should not eat and drink. Contact a food expert for more options. ?Questions to ask your doctor ?Diet and lifestyle changes are often the first steps that are taken to manage symptoms of GERD. If diet and lifestyle changes do not help, talk with your doctor about taking medicines. ?Where to find more information ?International [employer] Gastrointestinal Disorders: aboutgerd.org ?Summary ?When you have GERD, food and lifestyle choices are very important in easing your symptoms. ?Eat small meals often instead of 3 large meals a day. Eat your meals slowly and in a place where you are relaxed. ?Avoid bending over or lying down until 2-3 hours after eating. ?Limit high-fat foods such as fatty meats or fried foods. ?This information is not intended to replace advice given to you by your health care provider. Make sure you discuss any questions you have with your health care provider. ?Document Revised: 07/27/2019 Document Reviewed: 07/27/2019 ?Elsevier Patient Education ? 2022 Elsevier Inc. ? ? ? ? ?Managing Anxiety, Adult ?After being diagnosed with anxiety, you may be relieved to know why you have felt or behaved a certain way. You may also feel overwhelmed about the treatment ahead and what it will mean for your life. With care and support, you can manage this condition. ?How to manage lifestyle changes ?Managing stress and anxiety ?Stress is your body's reaction to life changes and events, both good and bad. Most stress will last just a  few hours, but stress can be ongoing and can lead to more than just stress. Although stress can play a major role in anxiety, it is not the same as anxiety. Stress is usually caused by something external, such as a deadline, test, or competition. Stress normally passes after the triggering event has ended.  ?Anxiety is caused by something internal, such as imagining a terrible outcome or worrying that something will go wrong that will devastate you. Anxiety often does not go away even after the triggering event is over, and it can become long-term (chronic) worry. It is important to understand the differences between stress and anxiety and to manage your stress effectively so that it does not lead to an anxious response. ?Talk with your health care provider or a counselor to learn more about reducing anxiety and stress. He or she may suggest tension reduction techniques, such as: ?Music therapy. Spend time creating or listening to music that you enjoy and that inspires you. ?Mindfulness-based meditation. Practice being aware of your normal breaths while not trying to control your breathing. It can be done while sitting or walking. ?Centering prayer. This involves focusing on a word, phrase, or sacred image that means something to you and brings you peace. ?Deep breathing. To  do this, expand your stomach and inhale slowly through your nose. Hold your breath for 3-5 seconds. Then exhale slowly, letting your stomach muscles relax. ?Self-talk. Learn to notice and identify thought patterns that lead to anxiety reactions and change those patterns to thoughts that feel peaceful. ?Muscle relaxation. Taking time to tense muscles and then relax them. ?Choose a tension reduction technique that fits your lifestyle and personality. These techniques take time and practice. Set aside 5-15 minutes a day to do them. Therapists can offer counseling and training in these techniques. The training to help with anxiety may be covered by  some insurance plans. ?Other things you can do to manage stress and anxiety include: ?Keeping a stress diary. This can help you learn what triggers your reaction and then learn ways to manage your respons

## 2021-04-19 LAB — CARDIOVASCULAR RISK ASSESSMENT

## 2021-04-19 LAB — CBC WITH DIFFERENTIAL/PLATELET
Basophils Absolute: 0.1 10*3/uL (ref 0.0–0.2)
Basos: 1 %
EOS (ABSOLUTE): 0.3 10*3/uL (ref 0.0–0.4)
Eos: 4 %
Hematocrit: 42.3 % (ref 34.0–46.6)
Hemoglobin: 14.2 g/dL (ref 11.1–15.9)
Immature Grans (Abs): 0 10*3/uL (ref 0.0–0.1)
Immature Granulocytes: 0 %
Lymphocytes Absolute: 2.5 10*3/uL (ref 0.7–3.1)
Lymphs: 35 %
MCH: 30.9 pg (ref 26.6–33.0)
MCHC: 33.6 g/dL (ref 31.5–35.7)
MCV: 92 fL (ref 79–97)
Monocytes Absolute: 0.6 10*3/uL (ref 0.1–0.9)
Monocytes: 9 %
Neutrophils Absolute: 3.8 10*3/uL (ref 1.4–7.0)
Neutrophils: 51 %
Platelets: 392 10*3/uL (ref 150–450)
RBC: 4.59 x10E6/uL (ref 3.77–5.28)
RDW: 11.9 % (ref 11.7–15.4)
WBC: 7.3 10*3/uL (ref 3.4–10.8)

## 2021-04-19 LAB — COMPREHENSIVE METABOLIC PANEL
ALT: 10 IU/L (ref 0–32)
AST: 11 IU/L (ref 0–40)
Albumin/Globulin Ratio: 1.8 (ref 1.2–2.2)
Albumin: 4.1 g/dL (ref 3.8–4.8)
Alkaline Phosphatase: 111 IU/L (ref 44–121)
BUN/Creatinine Ratio: 16 (ref 9–23)
BUN: 15 mg/dL (ref 6–24)
Bilirubin Total: 0.2 mg/dL (ref 0.0–1.2)
CO2: 26 mmol/L (ref 20–29)
Calcium: 9 mg/dL (ref 8.7–10.2)
Chloride: 106 mmol/L (ref 96–106)
Creatinine, Ser: 0.95 mg/dL (ref 0.57–1.00)
Globulin, Total: 2.3 g/dL (ref 1.5–4.5)
Glucose: 111 mg/dL — ABNORMAL HIGH (ref 70–99)
Potassium: 4.8 mmol/L (ref 3.5–5.2)
Sodium: 142 mmol/L (ref 134–144)
Total Protein: 6.4 g/dL (ref 6.0–8.5)
eGFR: 74 mL/min/{1.73_m2} (ref >59)

## 2021-04-19 LAB — LIPID PANEL
Chol/HDL Ratio: 4.9 ratio — ABNORMAL HIGH (ref 0.0–4.4)
Cholesterol, Total: 166 mg/dL (ref 100–199)
HDL: 34 mg/dL — ABNORMAL LOW (ref >39)
LDL Chol Calc (NIH): 108 mg/dL — ABNORMAL HIGH (ref 0–99)
Triglycerides: 135 mg/dL (ref 0–149)
VLDL Cholesterol Cal: 24 mg/dL (ref 5–40)

## 2021-04-19 LAB — VITAMIN D 25 HYDROXY (VIT D DEFICIENCY, FRACTURES): Vit D, 25-Hydroxy: 20.7 ng/mL — ABNORMAL LOW (ref 30.0–100.0)

## 2021-04-19 LAB — TSH: TSH: 1.72 u[IU]/mL (ref 0.450–4.500)

## 2021-04-26 ENCOUNTER — Other Ambulatory Visit: Payer: Self-pay

## 2021-04-26 MED ORDER — ROSUVASTATIN CALCIUM 10 MG PO TABS: 10.0000 mg | ORAL_TABLET | Freq: Every day | ORAL | 3 refills | Status: DC

## 2021-04-26 MED ORDER — VITAMIN D (ERGOCALCIFEROL) 1.25 MG (50000 UNIT) PO CAPS: 50000.0000 [IU] | ORAL_CAPSULE | ORAL | 3 refills | Status: DC

## 2021-04-26 NOTE — Telephone Encounter (Signed)
Refill sent to pharmacy.   

## 2021-04-27 LAB — SPECIMEN STATUS REPORT

## 2021-04-27 LAB — HGB A1C W/O EAG: Hgb A1c MFr Bld: 5.9 % — ABNORMAL HIGH (ref 4.8–5.6)

## 2021-05-09 ENCOUNTER — Encounter (HOSPITAL_BASED_OUTPATIENT_CLINIC_OR_DEPARTMENT_OTHER): Payer: Self-pay

## 2021-05-09 ENCOUNTER — Ambulatory Visit (HOSPITAL_BASED_OUTPATIENT_CLINIC_OR_DEPARTMENT_OTHER)
Admission: RE | Admit: 2021-05-09 | Discharge: 2021-05-09 | Disposition: A | Discharge status: Home / Self Care | Payer: 59 | Source: Ambulatory Visit | Attending: Nurse Practitioner | Admitting: Nurse Practitioner

## 2021-05-09 DIAGNOSIS — Z1231 Encounter for screening mammogram for malignant neoplasm of breast: Secondary | ICD-10-CM | POA: Insufficient documentation

## 2021-05-18 ENCOUNTER — Ambulatory Visit (INDEPENDENT_AMBULATORY_CARE_PROVIDER_SITE_OTHER): Payer: 59 | Admitting: Nurse Practitioner

## 2021-05-18 ENCOUNTER — Other Ambulatory Visit: Payer: Self-pay | Admitting: Nurse Practitioner

## 2021-05-18 ENCOUNTER — Encounter: Payer: Self-pay | Admitting: Nurse Practitioner

## 2021-05-18 VITALS — BP 128/72 | HR 94 | Resp 20 | Wt 187.6 lb

## 2021-05-18 DIAGNOSIS — S99921D Unspecified injury of right foot, subsequent encounter: Secondary | ICD-10-CM

## 2021-05-18 DIAGNOSIS — R7303 Prediabetes: Secondary | ICD-10-CM | POA: Diagnosis not present

## 2021-05-18 DIAGNOSIS — M792 Neuralgia and neuritis, unspecified: Secondary | ICD-10-CM

## 2021-05-18 DIAGNOSIS — F1911 Other psychoactive substance abuse, in remission: Secondary | ICD-10-CM

## 2021-05-18 DIAGNOSIS — F1210 Cannabis abuse, uncomplicated: Secondary | ICD-10-CM

## 2021-05-18 DIAGNOSIS — F339 Major depressive disorder, recurrent, unspecified: Secondary | ICD-10-CM | POA: Diagnosis not present

## 2021-05-18 DIAGNOSIS — F411 Generalized anxiety disorder: Secondary | ICD-10-CM

## 2021-05-18 DIAGNOSIS — J309 Allergic rhinitis, unspecified: Secondary | ICD-10-CM

## 2021-05-18 DIAGNOSIS — J449 Chronic obstructive pulmonary disease, unspecified: Secondary | ICD-10-CM | POA: Diagnosis not present

## 2021-05-18 MED ORDER — GABAPENTIN 300 MG PO CAPS: 300.0000 mg | ORAL_CAPSULE | Freq: Three times a day (TID) | ORAL | 0 refills | Status: DC

## 2021-05-18 MED ORDER — FEXOFENADINE HCL 180 MG PO TABS: 180.0000 mg | ORAL_TABLET | Freq: Every day | ORAL | 0 refills | Status: DC

## 2021-05-18 MED ORDER — ALBUTEROL SULFATE HFA 108 (90 BASE) MCG/ACT IN AERS: 2.0000 | INHALATION_SPRAY | Freq: Four times a day (QID) | RESPIRATORY_TRACT | 2 refills | Status: DC | PRN

## 2021-05-18 MED ORDER — METFORMIN HCL 500 MG PO TABS: 500.0000 mg | ORAL_TABLET | Freq: Every evening | ORAL | 3 refills | Status: DC

## 2021-05-18 NOTE — Progress Notes (Signed)
? ?Subjective:  ?Patient ID: Theresa Bowman, female    DOB: 05-06-1970  Age: 51 y.o. MRN: [id number] ? ?Chief Complaint  ?Patient presents with  ? Anxiety  ? ? ?HPI ?Theresa Bowman is a 51 year old Caucasian female that presents for follow-up of generalized anxiety disorder. She tells me she has been prescribed various medications for anxiety and depression that include: Trazodone, Citalopram, Buspar, Celexa, Xanax, and Valium. She tells me that smoking marijuana helps her chronic neuropathic pain and anxiety. She is not currently in counseling. States she was in group therapy at [facility] in the past. She has requested prescription for Xanax. Pt has past history of prescription pill abuse.  ? ?Theresa Bowman states she has an injury to right great toe for over 4-weeks. She tells me that her right great toe was struck by a door several weeks ago. Treatment has included Ibuprofen. She has not had prior imaging to right foot. State she is able to weight-bear on right foot and ambulate without difficulty.  ? ?  Anxiety, Follow-up ? ?She was last seen for anxiety 4 weeks ago. ?Changes made at last visit include citalopram 10 mg. ?  ?She reports good compliance with treatment. ?She reports fair tolerance of treatment. ?She is having side effects. Feels groggy  ? ?She feels her anxiety is severe and Unchanged since last visit. ? ?Symptoms: ?No chest pain Yes difficulty concentrating  ?No dizziness Yes fatigue  ?Yes feelings of losing control Yes insomnia  ?Yes irritable No palpitations  ?Yes panic attacks Yes racing thoughts  ?No shortness of breath No sweating  ?Yes tremors/shakes   ? ?GAD-7 Results ? ?  05/18/2021  ?  1:38 PM 04/18/2021  ?  7:42 AM 10/17/2020  ? 10:14 AM  ?GAD-7 Generalized Anxiety Disorder Screening Tool  ?1. Feeling Nervous, Anxious, or on Edge 3 3 3   ?2. Not Being Able to Stop or Control Worrying 3 3 3   ?3. Worrying Too Much About Different Things 3 3 3   ?4. Trouble Relaxing 3 3 3   ?5. Being So Restless it's Hard To Sit  Still 3 2 1   ?6. Becoming Easily Annoyed or Irritable 3 3 3   ?7. Feeling Afraid As If Something Awful Might Happen 3 3 3   ?Total GAD-7 Score 21 20 19   ?Difficulty At Work, Home, or Getting  Along With Others?   Somewhat difficult  ? ? ?PHQ-9 Scores ? ?  05/18/2021  ?  1:34 PM 04/18/2021  ?  7:40 AM 10/17/2020  ? 10:13 AM  ?PHQ9 SCORE ONLY  ?PHQ-9 Total Score 18 20 17   ? ? ?  ?Current Outpatient Medications on File Prior to Visit  ?Medication Sig Dispense Refill  ? albuterol (VENTOLIN HFA) 108 (90 Base) MCG/ACT inhaler Inhale into the lungs.    ? citalopram (CELEXA) 10 MG tablet Take 1 tablet (10 mg total) by mouth daily. 30 tablet 3  ? gabapentin (NEURONTIN) 300 MG capsule Take 1 capsule (300 mg total) by mouth 3 (three) times daily. 180 capsule 0  ? omeprazole (PRILOSEC) 20 MG capsule Take 1 capsule (20 mg total) by mouth daily. 90 capsule 3  ? rosuvastatin (CRESTOR) 10 MG tablet Take 1 tablet (10 mg total) by mouth daily. 90 tablet 3  ? Vitamin D, Ergocalciferol, (DRISDOL) 1.25 MG (50000 UNIT) CAPS capsule Take 1 capsule (50,000 Units total) by mouth every 7 (seven) days. 5 capsule 3  ? ?No current facility-administered medications on file prior to visit.  ? ?Past Medical History:  ?Diagnosis  Date  ? ADHD   ? Arthritis   ? Asthma   ? Depression   ? Diverticulitis   ? Hyperlipidemia   ? Thyroid disease   ? ?Past Surgical History:  ?Procedure Laterality Date  ? ABDOMINAL HYSTERECTOMY    ? 2004  ? APPENDECTOMY    ? age 51, kindergarten  ? BACK SURGERY    ?  ?Family History  ?Problem Relation Age of Onset  ? Cancer Mother   ?     Lymphoma  ? Thyroid disease Mother   ? Kidney disease Father   ? Hypertension Sister   ? ?Social History  ? ?Socioeconomic History  ? Marital status: Divorced  ?  Spouse name: Not on file  ? Number of children: 1  ? Years of education: 8th grade  ? Highest education level: Not on file  ?Occupational History  ? Not on file  ?Tobacco Use  ? Smoking status: Some Days  ?  Types: Cigarettes  ?  Smokeless tobacco: Never  ?Substance and Sexual Activity  ? Alcohol use: Yes  ?  Alcohol/week: 2.0 standard drinks  ?  Types: 1 Glasses of wine, 1 Cans of beer per week  ? Drug use: Yes  ?  Types: Marijuana, Benzodiazepines  ?  Comment: pt has history of opiate addiction  ? Sexual activity: Yes  ?Other Topics Concern  ? Not on file  ?Social History Narrative  ? Not on file  ? ?Social Determinants of Health  ? ?Financial Resource Strain: Not on file  ?Food Insecurity: Not on file  ?Transportation Needs: No Transportation Needs  ? Lack of Transportation (Medical): No  ? Lack of Transportation (Non-Medical): No  ?Physical Activity: Not on file  ?Stress: Not on file  ?Social Connections: Not on file  ? ? ?Review of Systems  ?Musculoskeletal:  Positive for arthralgias (right great toe).  ?Allergic/Immunologic: Positive for environmental allergies.  ?Neurological:  Positive for tremors (bilateral hands).  ?Psychiatric/Behavioral:  Positive for decreased concentration and sleep disturbance (insomnia). The patient is nervous/anxious.   ? ? ?Objective:  ?BP 128/72   Pulse 94   Resp 20   Wt 187 lb 9.6 oz (85.1 kg)   SpO2 96%   BMI 36.64 kg/m?  ? ? ?  05/18/2021  ?  1:28 PM 04/18/2021  ?  7:37 AM 11/29/2020  ?  2:46 PM  ?BP/Weight  ?Systolic BP 128 118 134  ?Diastolic BP 72 80 68  ?Wt. (Lbs) 187.6 188.6 185  ?BMI 36.64 kg/m2 36.83 kg/m2 36.13 kg/m2  ? ? ?Physical Exam ?Vitals reviewed.  ?Cardiovascular:  ?   Rate and Rhythm: Normal rate and regular rhythm.  ?   Pulses: Normal pulses.  ?   Heart sounds: Normal heart sounds.  ?Pulmonary:  ?   Effort: Pulmonary effort is normal.  ?   Breath sounds: Normal breath sounds.  ?Abdominal:  ?   General: Bowel sounds are normal.  ?   Palpations: Abdomen is soft.  ?Musculoskeletal:     ?   General: Swelling (right great toe) and tenderness (right great toe) present.  ?Skin: ?   General: Skin is warm and dry.  ?   Capillary Refill: Capillary refill takes less than 2 seconds.   ?Neurological:  ?   Mental Status: She is alert and oriented to person, place, and time. Mental status is at baseline.  ?Psychiatric:     ?   Mood and Affect: Mood normal.     ?  Behavior: Behavior normal.  ? ? ? ?  ? ?Lab Results  ?Component Value Date  ? WBC 7.3 04/18/2021  ? HGB 14.2 04/18/2021  ? HCT 42.3 04/18/2021  ? PLT 392 04/18/2021  ? GLUCOSE 111 (H) 04/18/2021  ? CHOL 166 04/18/2021  ? TRIG 135 04/18/2021  ? HDL 34 (L) 04/18/2021  ? LDLCALC 108 (H) 04/18/2021  ? ALT 10 04/18/2021  ? AST 11 04/18/2021  ? NA 142 04/18/2021  ? K 4.8 04/18/2021  ? CL 106 04/18/2021  ? CREATININE 0.95 04/18/2021  ? BUN 15 04/18/2021  ? CO2 26 04/18/2021  ? TSH 1.720 04/18/2021  ? HGBA1C 5.9 (H) 04/18/2021  ? ? ? ? ?Assessment & Plan:  ? ?1. Generalized anxiety disorder-not at goal ?- Ambulatory referral to Psychiatry ? ?2. Depression, recurrent (HCC)-not at goal ?- Ambulatory referral to Psychiatry ? ?3. Prediabetes ?- metFORMIN (GLUCOPHAGE) 500 MG tablet; Take 1 tablet (500 mg total) by mouth every evening.  Dispense: 180 tablet; Refill: 3 ? ?4. Chronic obstructive pulmonary disease, unspecified COPD type (HCC) ?- albuterol (PROAIR HFA) 108 (90 Base) MCG/ACT inhaler; Inhale 2 puffs into the lungs every 6 (six) hours as needed for wheezing or shortness of breath.  Dispense: 1 each; Refill: 2 ? ?5. Chronic allergic rhinitis ?- fexofenadine (ALLEGRA ALLERGY) 180 MG tablet; Take 1 tablet (180 mg total) by mouth daily.  Dispense: 90 tablet; Refill: 0 ? ?6. Injury of right great toe, subsequent encounter ?-Ibuprofen as needed for pain ?-wear ace compression wrap as needed ?-ice and elevate right foot ?  ? ? ? ?We will call you with referral appointment for psychiatry ?Rest, ice, elevate right foot ?Take Ibuprofen for pain, with food  ?Take Allegra as prescribed ?Consider Genesight testing ?Follow-up in 163-months ? ?Follow-up: 193-months ? ?An After Visit Summary was printed and given to the patient. ? ?I,Victoria C Greene,acting as  a Neurosurgeonscribe for BJ's WholesaleShannon J Endre Coutts, NP.,have documented all relevant documentation on the behalf of Janie MorningShannon J Kelsay Haggard, NP,as directed by  Janie MorningShannon J Jaxxen Voong, NP while in the presence of Janie MorningShannon J Zyann Mabry, NP. ? ?I, Janie MorningShannon J Salisa Broz, N

## 2021-05-18 NOTE — Patient Instructions (Addendum)
We will call you with referral appointment for psychiatry ?Rest, ice, elevate right foot ?Take Ibuprofen for pain, with food  ?Take Allegra as prescribed ?Consider Genesight testing ?Follow-up in 57-months ?Toe Fracture ?A toe fracture is a break in one of the toe bones (phalanges). This may happen if you: ?Drop a heavy object on your toe. ?Stub your toe. ?Twist your toe. ?Exercise the same way too much. ?What are the signs or symptoms? ?The main symptoms are swelling and pain in the toe. You may also have: ?Bruising. ?Stiffness. ?Numbness. ?A change in the way the toe looks. ?Broken bones that poke through the skin. ?Blood under the toenail. ?How is this treated? ?Treatments may include: ?Taping the broken toe to a toe that is next to it (buddy taping). ?Wearing a shoe that has a wide, rigid sole to protect the toe and to limit its movement. ?Wearing a cast. ?Surgery. This may be needed if the: ?Pieces of broken bone are out of place. ?Bone pokes through the skin. ?Physical therapy. ?Follow these instructions at home: ?If you have a shoe: ?Wear the shoe as told by your doctor. Remove it only as told by your doctor. ?Loosen the shoe if your toes tingle, become numb, or turn cold and blue. ?Keep the shoe clean and dry. ?If you have a cast: ?Do not put pressure on any part of the cast until it is fully hardened. This may take a few hours. ?Do not stick anything inside the cast to scratch your skin. ?Check the skin around the cast every day. Tell your doctor about any concerns. ?You may put lotion on dry skin around the edges of the cast. ?Do not put lotion on the skin under the cast. ?Keep the cast clean and dry. ?Bathing ?Do not take baths, swim, or use a hot tub until your doctor says it is okay. Ask your doctor if you can take showers. ?If the shoe or cast is not waterproof: ?Do not let it get wet. ?Cover it with a watertight covering when you take a bath or a shower. ?Activity ?Do not use your foot to support your  body weight until your doctor says it is okay. ?Use crutches as told by your doctor. ?Ask your doctor what activities are safe for you during recovery. ?Avoid activities as told by your doctor. ?Do exercises as told by your doctor or therapist. ?Driving ?Do not drive or use heavy machinery while taking pain medicine. ?Do not drive while wearing a cast on a foot that you use for driving. ?Managing pain, stiffness, and swelling ? ?Put ice on the injured area if told by your doctor: ?Put ice in a plastic bag. ?Place a towel between your skin and the bag. ?If you have a shoe, remove it as told by your doctor. ?If you have a cast, place a towel between your cast and the bag. ?Leave the ice on for 20 minutes, 2-3 times per day. ?Raise (elevate) the injured area above the level of your heart while you are sitting or lying down. ?General instructions ?If your toe was taped to a toe that is next to it, follow your doctor's instructions for changing the gauze and tape. Change it more often: ?If the gauze and tape get wet. If this happens, dry the space between the toes. ?If the gauze and tape are too tight and they cause your toe to become pale or to lose feeling (go numb). ?If your doctor did not give you a protective shoe,  wear sturdy shoes that support your foot. Your shoes should not: ?Pinch your toes. ?Fit tightly against your toes. ?Do not use any tobacco products, including cigarettes, chewing tobacco, or e-cigarettes. These can delay bone healing. If you need help quitting, ask your doctor. ?Take medicines only as told by your doctor. ?Keep all follow-up visits as told by your doctor. This is important. ?Contact a doctor if: ?Your pain medicine is not helping. ?You have a fever. ?You notice a bad smell coming from your cast. ?Get help right away if: ?You lose feeling (have numbness) in your toe or foot, and it is getting worse. ?Your toe or your foot tingles. ?Your toe or your foot gets cold or turns blue. ?You have  redness or swelling in your toe or foot, and it is getting worse. ?You have very bad pain. ?Summary ?A toe fracture is a break in one of the toe bones. ?Use ice and raise your foot. This will help lessen pain and swelling. ?Use crutches as told by your doctor. ?This information is not intended to replace advice given to you by your health care provider. Make sure you discuss any questions you have with your health care provider. ?Document Revised: 06/20/2020 Document Reviewed: 06/20/2020 ?Elsevier Patient Education ? 2023 [employer] ? ? ? ?Managing Anxiety, Adult ?After being diagnosed with anxiety, you may be relieved to know why you have felt or behaved a certain way. You may also feel overwhelmed about the treatment ahead and what it will mean for your life. With care and support, you can manage this condition. ?How to manage lifestyle changes ?Managing stress and anxiety ? ?Stress is your body's reaction to life changes and events, both good and bad. Most stress will last just a few hours, but stress can be ongoing and can lead to more than just stress. Although stress can play a major role in anxiety, it is not the same as anxiety. Stress is usually caused by something external, such as a deadline, test, or competition. Stress normally passes after the triggering event has ended.  ?Anxiety is caused by something internal, such as imagining a terrible outcome or worrying that something will go wrong that will devastate you. Anxiety often does not go away even after the triggering event is over, and it can become long-term (chronic) worry. It is important to understand the differences between stress and anxiety and to manage your stress effectively so that it does not lead to an anxious response. ?Talk with your health care provider or a counselor to learn more about reducing anxiety and stress. He or she may suggest tension reduction techniques, such as: ?Music therapy. Spend time creating or listening to  music that you enjoy and that inspires you. ?Mindfulness-based meditation. Practice being aware of your normal breaths while not trying to control your breathing. It can be done while sitting or walking. ?Centering prayer. This involves focusing on a word, phrase, or sacred image that means something to you and brings you peace. ?Deep breathing. To do this, expand your stomach and inhale slowly through your nose. Hold your breath for 3-5 seconds. Then exhale slowly, letting your stomach muscles relax. ?Self-talk. Learn to notice and identify thought patterns that lead to anxiety reactions and change those patterns to thoughts that feel peaceful. ?Muscle relaxation. Taking time to tense muscles and then relax them. ?Choose a tension reduction technique that fits your lifestyle and personality. These techniques take time and practice. Set aside 5-15 minutes a day to  do them. Therapists can offer counseling and training in these techniques. The training to help with anxiety may be covered by some insurance plans. ?Other things you can do to manage stress and anxiety include: ?Keeping a stress diary. This can help you learn what triggers your reaction and then learn ways to manage your response. ?Thinking about how you react to certain situations. You may not be able to control everything, but you can control your response. ?Making time for activities that help you relax and not feeling guilty about spending your time in this way. ?Doing visual imagery. This involves imagining or creating mental pictures to help you relax. ?Practicing yoga. Through yoga poses, you can lower tension and promote relaxation. ? ?Medicines ?Medicines can help ease symptoms. Medicines for anxiety include: ?Antidepressant medicines. These are usually prescribed for long-term daily control. ?Anti-anxiety medicines. These may be added in severe cases, especially when panic attacks occur. ?Medicines will be prescribed by a health care provider.  When used together, medicines, psychotherapy, and tension reduction techniques may be the most effective treatment. ?Relationships ?Relationships can play a big part in helping you recover. Try to spend mo

## 2021-08-17 NOTE — Progress Notes (Signed)
Subjective:  Patient ID: Theresa Bowman, female    DOB: [birth year]  Age: 51 y.o. MRN: [id number]  Chief Complaint  Patient presents with   Hyperlipidemia   Hypertension   Anxiety   Depression    HPI Pt presents for follow-up of HTN, GERD, Prediabetes. She has uncontrolled depression with anxiety. She discontinue Celexa, declines alternative prescription therapy or counseling at this time. Pt admits to taking Xanax "off the street" and smoking marijuana regularly. States she has "tried everything". Denies suicidal or homicidal thoughts.    Hypertension Follow up She was last seen for hypertension 3 months ago diet and exercise BP at that visit was 118/80. Management includes . She reports excellent compliance with treatment. She is following a Regular diet. She is not exercising. She does smoke.   Pertinent labs: Lab Results  Component Value Date   CHOL 166 04/18/2021   HDL 34 (L) 04/18/2021   LDLCALC 108 (H) 04/18/2021   TRIG 135 04/18/2021   CHOLHDL 4.9 (H) 04/18/2021   Lab Results  Component Value Date   NA 142 04/18/2021   K 4.8 04/18/2021   CREATININE 0.95 04/18/2021   EGFR 74 04/18/2021   GLUCOSE 111 (H) 04/18/2021     The 10-year ASCVD risk score (Arnett DK, et al., 2019) is: 4.1%   Lipid/Cholesterol, Follow-up  Last lipid panel Other pertinent labs  Lab Results  Component Value Date   CHOL 166 04/18/2021   HDL 34 (L) 04/18/2021   LDLCALC 108 (H) 04/18/2021   TRIG 135 04/18/2021   CHOLHDL 4.9 (H) 04/18/2021   Lab Results  Component Value Date   ALT 10 04/18/2021   AST 11 04/18/2021   PLT 392 04/18/2021   TSH 1.720 04/18/2021     Management includes Crestor. She reports excellent compliance with treatment. She is not having side effects.   Prediabetes, Follow-up  Lab Results  Component Value Date   HGBA1C 5.9 (H) 04/18/2021   HGBA1C 6.3 (H) 07/15/2020   GLUCOSE 111 (H) 04/18/2021   GLUCOSE 138 (H) 10/17/2020   GLUCOSE 148 (H)  07/15/2020    Management since that visit includes Metformin. Current diet: in general, an "unhealthy" diet Current exercise: none  Pertinent Labs:    Component Value Date/Time   CHOL 166 04/18/2021 0806   TRIG 135 04/18/2021 0806   CHOLHDL 4.9 (H) 04/18/2021 0806   CREATININE 0.95 04/18/2021 0806    Wt Readings from Last 3 Encounters:  08/21/21 183 lb (83 kg)  05/18/21 187 lb 9.6 oz (85.1 kg)  04/18/21 188 lb 9.6 oz (85.5 kg)    GERD, Follow up:  Current treatment consist of: Prilosec She reports excellent compliance with treatment. She is not having side effects.  She is NOT experiencing heartburn  Vitamin D deficiency, follow-up  Lab Results  Component Value Date   VD25OH 20.7 (L) 04/18/2021   VD25OH 14.6 (L) 07/15/2020   CALCIUM 9.0 04/18/2021   CALCIUM 9.1 10/17/2020   Wt Readings from Last 3 Encounters:  08/21/21 183 lb (83 kg)  05/18/21 187 lb 9.6 oz (85.1 kg)  04/18/21 188 lb 9.6 oz (85.5 kg)   Management since that visit includes vitamin d 50,000 u weekly. She reports excellent compliance with treatment. She is not having side effects.    ---------------------------------------------------------------------------------------------------   Depression, Follow-up  Not currently taking medications or in counseling  She reports excellent compliance with treatment. She is not having side effects.  She reports good tolerance of treatment. Current symptoms  include: fatigue She feels she is Worse since last visit.     08/21/2021   11:14 AM 05/18/2021    1:34 PM 04/18/2021    7:40 AM  Depression screen PHQ 2/9  Decreased Interest 2 3 1   Down, Depressed, Hopeless 1 2 3   PHQ - 2 Score 3 5 4   Altered sleeping 3 2 3   Tired, decreased energy 2 1 1   Change in appetite 2 1 3   Feeling bad or failure about yourself  1 3 3   Trouble concentrating 2 3 3   Moving slowly or fidgety/restless 2 3 3   Suicidal thoughts 0 0 0  PHQ-9 Score 15 18 20   Difficult doing  work/chores Not difficult at all Very difficult     Anxiety, Follow-up  Current treatment includes was referred to Psychic. Not currently in counseling.   She reports good compliance with treatment. She reports excellent tolerance of treatment. She is not having side effects.  She feels her anxiety is severe and Worse since last visit.Declines treatment or counseling at this time   GAD-7 Results    05/18/2021    1:38 PM 04/18/2021    7:42 AM 10/17/2020   10:14 AM  GAD-7 Generalized Anxiety Disorder Screening Tool  1. Feeling Nervous, Anxious, or on Edge 3 3 3   2. Not Being Able to Stop or Control Worrying 3 3 3   3. Worrying Too Much About Different Things 3 3 3   4. Trouble Relaxing 3 3 3   5. Being So Restless it's Hard To Sit Still 3 2 1   6. Becoming Easily Annoyed or Irritable 3 3 3   7. Feeling Afraid As If Something Awful Might Happen 3 3 3   Total GAD-7 Score 21 20 19   Difficulty At Work, Home, or Getting  Along With Others?   Somewhat difficult    PHQ-9 Scores    08/21/2021   11:14 AM 05/18/2021    1:34 PM 04/18/2021    7:40 AM  PHQ9 SCORE ONLY  PHQ-9 Total Score 15 18 20     Current Outpatient Medications on File Prior to Visit  Medication Sig Dispense Refill   albuterol (PROAIR HFA) 108 (90 Base) MCG/ACT inhaler Inhale 2 puffs into the lungs every 6 (six) hours as needed for wheezing or shortness of breath. 1 each 2   albuterol (VENTOLIN HFA) 108 (90 Base) MCG/ACT inhaler Inhale into the lungs.     citalopram (CELEXA) 10 MG tablet Take 1 tablet (10 mg total) by mouth daily. 30 tablet 3   gabapentin (NEURONTIN) 300 MG capsule Take 1 capsule (300 mg total) by mouth 3 (three) times daily. 180 capsule 0   metFORMIN (GLUCOPHAGE) 500 MG tablet Take 1 tablet (500 mg total) by mouth every evening. 180 tablet 3   omeprazole (PRILOSEC) 20 MG capsule Take 1 capsule (20 mg total) by mouth daily. 90 capsule 3   Vitamin D, Ergocalciferol, (DRISDOL) 1.25 MG (50000 UNIT) CAPS capsule  Take 1 capsule (50,000 Units total) by mouth every 7 (seven) days. 5 capsule 3   No current facility-administered medications on file prior to visit.   Past Medical History:  Diagnosis Date   ADHD    Arthritis    Asthma    Depression    Diverticulitis    Hyperlipidemia    Thyroid disease    Past Surgical History:  Procedure Laterality Date   ABDOMINAL HYSTERECTOMY     2004   APPENDECTOMY     age 40, kindergarten   BACK SURGERY  Family History  Problem Relation Age of Onset   Cancer Mother        Lymphoma   Thyroid disease Mother    Kidney disease Father    Hypertension Sister    Social History   Socioeconomic History   Marital status: Divorced    Spouse name: Not on file   Number of children: 1   Years of education: 8th grade   Highest education level: Not on file  Occupational History   Not on file  Tobacco Use   Smoking status: Some Days    Types: Cigarettes   Smokeless tobacco: Never  Substance and Sexual Activity   Alcohol use: Yes    Alcohol/week: 2.0 standard drinks of alcohol    Types: 1 Glasses of wine, 1 Cans of beer per week   Drug use: Yes    Types: Marijuana, Benzodiazepines    Comment: pt has history of opiate addiction   Sexual activity: Yes  Other Topics Concern   Not on file  Social History Narrative   Not on file   Social Determinants of Health   Financial Resource Strain: Not on file  Food Insecurity: Not on file  Transportation Needs: No Transportation Needs (07/13/2020)   PRAPARE - Hydrologist (Medical): No    Lack of Transportation (Non-Medical): No  Physical Activity: Not on file  Stress: Not on file  Social Connections: Not on file    Review of Systems  Constitutional:  Negative for chills, fatigue and fever.  HENT:  Negative for congestion, ear pain, rhinorrhea and sore throat.   Respiratory:  Negative for cough and shortness of breath.   Cardiovascular:  Negative for chest pain.   Gastrointestinal:  Negative for abdominal pain, constipation, diarrhea, nausea and vomiting.  Genitourinary:  Negative for dysuria and urgency.  Musculoskeletal:  Negative for back pain and myalgias.  Neurological:  Negative for dizziness, weakness, light-headedness and headaches.  Psychiatric/Behavioral:  Negative for dysphoric mood. The patient is nervous/anxious.      Objective:  BP 120/72 (BP Location: Left Arm, Patient Position: Sitting)   Pulse 86   Temp 98.6 F (37 C) (Oral)   Ht 5' (1.524 m)   Wt 183 lb (83 kg)   SpO2 96%   BMI 35.74 kg/m      08/21/2021   11:07 AM 05/18/2021    1:28 PM 04/18/2021    7:37 AM  BP/Weight  Systolic BP 462 703 500  Diastolic BP 72 72 80  Wt. (Lbs) 183 187.6 188.6  BMI 35.74 kg/m2 36.64 kg/m2 36.83 kg/m2    Physical Exam Vitals reviewed.  Constitutional:      Appearance: She is obese.  Cardiovascular:     Rate and Rhythm: Normal rate and regular rhythm.     Pulses: Normal pulses.     Heart sounds: Normal heart sounds.  Pulmonary:     Effort: Pulmonary effort is normal.     Breath sounds: Normal breath sounds.  Abdominal:     General: Bowel sounds are normal.     Palpations: Abdomen is soft.  Skin:    Capillary Refill: Capillary refill takes less than 2 seconds.  Neurological:     General: No focal deficit present.     Mental Status: She is alert and oriented to person, place, and time.  Psychiatric:        Mood and Affect: Mood normal.        Behavior: Behavior normal.  Lab Results  Component Value Date   WBC 7.3 04/18/2021   HGB 14.2 04/18/2021   HCT 42.3 04/18/2021   PLT 392 04/18/2021   GLUCOSE 111 (H) 04/18/2021   CHOL 166 04/18/2021   TRIG 135 04/18/2021   HDL 34 (L) 04/18/2021   LDLCALC 108 (H) 04/18/2021   ALT 10 04/18/2021   AST 11 04/18/2021   NA 142 04/18/2021   K 4.8 04/18/2021   CL 106 04/18/2021   CREATININE 0.95 04/18/2021   BUN 15 04/18/2021   CO2 26 04/18/2021   TSH 1.720 04/18/2021    HGBA1C 5.9 (H) 04/18/2021      Assessment & Plan:    1. Prediabetes-not at goal - CBC with Differential/Platelet - Hemoglobin A1c -continue Metformin 500 mg BID  2. Essential hypertension-well controlled - CBC with Differential/Platelet - Comprehensive metabolic panel   3. Gastroesophageal reflux disease, unspecified whether esophagitis present-well controlled - CBC with Differential/Platelet - Comprehensive metabolic panel -continue Prilosec 20 mg QD  4. Mixed hyperlipidemia-not at goal - CBC with Differential/Platelet - Comprehensive metabolic panel - Lipid panel -pt declines statin therapy  5. Vitamin D deficiency-not at goal - VITAMIN D 25 Hydroxy (Vit-D Deficiency, Fractures) -continue Vit D 50, 000 U weekly -continue Vit D rich diet  6. Generalized anxiety disorder-not at goal - CBC with Differential/Platelet - Comprehensive metabolic panel -pt declines medication or counseling at this time  7. Depression, recurrent (HCC)-not at goal - CBC with Differential/Platelet - Comprehensive metabolic panel -pt declines medication or counseling at this time  8. Cigarette nicotine dependence with other nicotine-induced disorder - CBC with Differential/Platelet - Comprehensive metabolic panel -recommend smoking cessation  9. Class 2 severe obesity due to excess calories with serious comorbidity and body mass index (BMI) of 35.0 to 35.9 in adult (HCC) - CBC with Differential/Platelet - Comprehensive metabolic panel - Hemoglobin A1c - Lipid panel - VITAMIN D 25 Hydroxy (Vit-D Deficiency, Fractures)     We will call you with lab results Continue medications Recommend counseling Consider smoking cessation Follow-up in 38-months  Follow-up: 63-months  An After Visit Summary was printed and given to the patient.  I, Rip Harbour, NP, have reviewed all documentation for this visit. The documentation on 08/21/21 for the exam, diagnosis, procedures, and orders  are all accurate and complete.   I,Lauren M Auman,acting as a Education administrator for [employer], NP.,have documented all relevant documentation on the behalf of Rip Harbour, NP,as directed by  Rip Harbour, NP while in the presence of Rip Harbour, NP.   Signed, Rip Harbour, NP [place] [phone]

## 2021-08-21 ENCOUNTER — Ambulatory Visit (INDEPENDENT_AMBULATORY_CARE_PROVIDER_SITE_OTHER): Payer: 59 | Admitting: Nurse Practitioner

## 2021-08-21 ENCOUNTER — Encounter: Payer: Self-pay | Admitting: Nurse Practitioner

## 2021-08-21 VITALS — BP 120/72 | HR 86 | Temp 98.6°F | Ht 60.0 in | Wt 183.0 lb

## 2021-08-21 DIAGNOSIS — F17218 Nicotine dependence, cigarettes, with other nicotine-induced disorders: Secondary | ICD-10-CM

## 2021-08-21 DIAGNOSIS — E079 Disorder of thyroid, unspecified: Secondary | ICD-10-CM

## 2021-08-21 DIAGNOSIS — I10 Essential (primary) hypertension: Secondary | ICD-10-CM | POA: Diagnosis not present

## 2021-08-21 DIAGNOSIS — E782 Mixed hyperlipidemia: Secondary | ICD-10-CM | POA: Diagnosis not present

## 2021-08-21 DIAGNOSIS — K219 Gastro-esophageal reflux disease without esophagitis: Secondary | ICD-10-CM | POA: Diagnosis not present

## 2021-08-21 DIAGNOSIS — E559 Vitamin D deficiency, unspecified: Secondary | ICD-10-CM

## 2021-08-21 DIAGNOSIS — R7303 Prediabetes: Secondary | ICD-10-CM

## 2021-08-21 DIAGNOSIS — J449 Chronic obstructive pulmonary disease, unspecified: Secondary | ICD-10-CM

## 2021-08-21 DIAGNOSIS — F339 Major depressive disorder, recurrent, unspecified: Secondary | ICD-10-CM

## 2021-08-21 DIAGNOSIS — Z6835 Body mass index (BMI) 35.0-35.9, adult: Secondary | ICD-10-CM

## 2021-08-21 DIAGNOSIS — F411 Generalized anxiety disorder: Secondary | ICD-10-CM

## 2021-08-21 NOTE — Patient Instructions (Addendum)
We will call you with lab results Continue medications Recommend counseling Consider smoking cessation Follow-up in 43-months  Prediabetes Eating Plan Prediabetes is a condition that causes blood sugar (glucose) levels to be higher than normal. This increases the risk for developing type 2 diabetes (type 2 diabetes mellitus). Working with a health care provider or nutrition specialist (dietitian) to make diet and lifestyle changes can help prevent the onset of diabetes. These changes may help you: Control your blood glucose levels. Improve your cholesterol levels. Manage your blood pressure. What are tips for following this plan? Reading food labels Read food labels to check the amount of fat, salt (sodium), and sugar in prepackaged foods. Avoid foods that have: Saturated fats. Trans fats. Added sugars. Avoid foods that have more than 300 milligrams (mg) of sodium per serving. Limit your sodium intake to less than 2,300 mg each day. Shopping Avoid buying pre-made and processed foods. Avoid buying drinks with added sugar. Cooking Cook with olive oil. Do not use butter, lard, or ghee. Bake, broil, grill, steam, or boil foods. Avoid frying. Meal planning  Work with your dietitian to create an eating plan that is right for you. This may include tracking how many calories you take in each day. Use a food diary, notebook, or mobile application to track what you eat at each meal. Consider following a Mediterranean diet. This includes: Eating several servings of fresh fruits and vegetables each day. Eating fish at least twice a week. Eating one serving each day of whole grains, beans, nuts, and seeds. Using olive oil instead of other fats. Limiting alcohol. Limiting red meat. Using nonfat or low-fat dairy products. Consider following a plant-based diet. This includes dietary choices that focus on eating mostly vegetables and fruit, grains, beans, nuts, and seeds. If you have high blood  pressure, you may need to limit your sodium intake or follow a diet such as the DASH (Dietary Approaches to Stop Hypertension) eating plan. The DASH diet aims to lower high blood pressure. Lifestyle Set weight loss goals with help from your health care team. It is recommended that most people with prediabetes lose 7% of their body weight. Exercise for at least 30 minutes 5 or more days a week. Attend a support group or seek support from a mental health counselor. Take over-the-counter and prescription medicines only as told by your health care provider. What foods are recommended? Fruits Berries. Bananas. Apples. Oranges. Grapes. Papaya. Mango. Pomegranate. Kiwi. Grapefruit. Cherries. Vegetables Lettuce. Spinach. Peas. Beets. Cauliflower. Cabbage. Broccoli. Carrots. Tomatoes. Squash. Eggplant. Herbs. Peppers. Onions. Cucumbers. Brussels sprouts. Grains Whole grains, such as whole-wheat or whole-grain breads, crackers, cereals, and pasta. Unsweetened oatmeal. Bulgur. Barley. Quinoa. Brown rice. Corn or whole-wheat flour tortillas or taco shells. Meats and other proteins Seafood. Poultry without skin. Lean cuts of pork and beef. Tofu. Eggs. Nuts. Beans. Dairy Low-fat or fat-free dairy products, such as yogurt, cottage cheese, and cheese. Beverages Water. Tea. Coffee. Sugar-free or diet soda. Seltzer water. Low-fat or nonfat milk. Milk alternatives, such as soy or almond milk. Fats and oils Olive oil. Canola oil. Sunflower oil. Grapeseed oil. Avocado. Walnuts. Sweets and desserts Sugar-free or low-fat pudding. Sugar-free or low-fat ice cream and other frozen treats. Seasonings and condiments Herbs. Sodium-free spices. Mustard. Relish. Low-salt, low-sugar ketchup. Low-salt, low-sugar barbecue sauce. Low-fat or fat-free mayonnaise. The items listed above may not be a complete list of recommended foods and beverages. Contact a dietitian for more information. What foods are not  recommended? Fruits Fruits canned  with syrup. Vegetables Canned vegetables. Frozen vegetables with butter or cream sauce. Grains Refined white flour and flour products, such as bread, pasta, snack foods, and cereals. Meats and other proteins Fatty cuts of meat. Poultry with skin. Breaded or fried meat. Processed meats. Dairy Full-fat yogurt, cheese, or milk. Beverages Sweetened drinks, such as iced tea and soda. Fats and oils Butter. Lard. Ghee. Sweets and desserts Baked goods, such as cake, cupcakes, pastries, cookies, and cheesecake. Seasonings and condiments Spice mixes with added salt. Ketchup. Barbecue sauce. Mayonnaise. The items listed above may not be a complete list of foods and beverages that are not recommended. Contact a dietitian for more information. Where to find more information American Diabetes Association: www.diabetes.org Summary You may need to make diet and lifestyle changes to help prevent the onset of diabetes. These changes can help you control blood sugar, improve cholesterol levels, and manage blood pressure. Set weight loss goals with help from your health care team. It is recommended that most people with prediabetes lose 7% of their body weight. Consider following a Mediterranean diet. This includes eating plenty of fresh fruits and vegetables, whole grains, beans, nuts, seeds, fish, and low-fat dairy, and using olive oil instead of other fats. This information is not intended to replace advice given to you by your health care provider. Make sure you discuss any questions you have with your health care provider. Document Revised: 04/16/2019 Document Reviewed: 04/16/2019 Elsevier Patient Education  2023 Elsevier Inc.  Managing Anxiety, Adult After being diagnosed with anxiety, you may be relieved to know why you have felt or behaved a certain way. You may also feel overwhelmed about the treatment ahead and what it will mean for your life. With care and  support, you can manage this condition. How to manage lifestyle changes Managing stress and anxiety  Stress is your body's reaction to life changes and events, both good and bad. Most stress will last just a few hours, but stress can be ongoing and can lead to more than just stress. Although stress can play a major role in anxiety, it is not the same as anxiety. Stress is usually caused by something external, such as a deadline, test, or competition. Stress normally passes after the triggering event has ended.  Anxiety is caused by something internal, such as imagining a terrible outcome or worrying that something will go wrong that will devastate you. Anxiety often does not go away even after the triggering event is over, and it can become long-term (chronic) worry. It is important to understand the differences between stress and anxiety and to manage your stress effectively so that it does not lead to an anxious response. Talk with your health care provider or a counselor to learn more about reducing anxiety and stress. He or she may suggest tension reduction techniques, such as: Music therapy. Spend time creating or listening to music that you enjoy and that inspires you. Mindfulness-based meditation. Practice being aware of your normal breaths while not trying to control your breathing. It can be done while sitting or walking. Centering prayer. This involves focusing on a word, phrase, or sacred image that means something to you and brings you peace. Deep breathing. To do this, expand your stomach and inhale slowly through your nose. Hold your breath for 3-5 seconds. Then exhale slowly, letting your stomach muscles relax. Self-talk. Learn to notice and identify thought patterns that lead to anxiety reactions and change those patterns to thoughts that feel peaceful. Muscle relaxation. Taking  time to tense muscles and then relax them. Choose a tension reduction technique that fits your lifestyle and  personality. These techniques take time and practice. Set aside 5-15 minutes a day to do them. Therapists can offer counseling and training in these techniques. The training to help with anxiety may be covered by some insurance plans. Other things you can do to manage stress and anxiety include: Keeping a stress diary. This can help you learn what triggers your reaction and then learn ways to manage your response. Thinking about how you react to certain situations. You may not be able to control everything, but you can control your response. Making time for activities that help you relax and not feeling guilty about spending your time in this way. Doing visual imagery. This involves imagining or creating mental pictures to help you relax. Practicing yoga. Through yoga poses, you can lower tension and promote relaxation.  Medicines Medicines can help ease symptoms. Medicines for anxiety include: Antidepressant medicines. These are usually prescribed for long-term daily control. Anti-anxiety medicines. These may be added in severe cases, especially when panic attacks occur. Medicines will be prescribed by a health care provider. When used together, medicines, psychotherapy, and tension reduction techniques may be the most effective treatment. Relationships Relationships can play a big part in helping you recover. Try to spend more time connecting with trusted friends and family members. Consider going to couples counseling if you have a partner, taking family education classes, or going to family therapy. Therapy can help you and others better understand your condition. How to recognize changes in your anxiety Everyone responds differently to treatment for anxiety. Recovery from anxiety happens when symptoms decrease and stop interfering with your daily activities at home or work. This may mean that you will start to: Have better concentration and focus. Worry will interfere less in your daily  thinking. Sleep better. Be less irritable. Have more energy. Have improved memory. It is also important to recognize when your condition is getting worse. Contact your health care provider if your symptoms interfere with home or work and you feel like your condition is not improving. Follow these instructions at home: Activity Exercise. Adults should do the following: Exercise for at least 150 minutes each week. The exercise should increase your heart rate and make you sweat (moderate-intensity exercise). Strengthening exercises at least twice a week. Get the right amount and quality of sleep. Most adults need 7-9 hours of sleep each night. Lifestyle  Eat a healthy diet that includes plenty of vegetables, fruits, whole grains, low-fat dairy products, and lean protein. Do not eat a lot of foods that are high in fats, added sugars, or salt (sodium). Make choices that simplify your life. Do not use any products that contain nicotine or tobacco. These products include cigarettes, chewing tobacco, and vaping devices, such as e-cigarettes. If you need help quitting, ask your health care provider. Avoid caffeine, alcohol, and certain over-the-counter cold medicines. These may make you feel worse. Ask your pharmacist which medicines to avoid. General instructions Take over-the-counter and prescription medicines only as told by your health care provider. Keep all follow-up visits. This is important. Where to find support You can get help and support from these sources: Self-help groups. Online and [employer]. A trusted spiritual leader. Couples counseling. Family education classes. Family therapy. Where to find more information You may find that joining a support group helps you deal with your anxiety. The following sources can help you locate counselors or support  groups near you: [place]: www.mentalhealthamerica.net Anxiety and Depression Association of [place]  (ADAA): https://www.clark.net/ National Alliance on Mental Illness (NAMI): www.nami.org Contact a health care provider if: You have a hard time staying focused or finishing daily tasks. You spend many hours a day feeling worried about everyday life. You become exhausted by worry. You start to have headaches or frequently feel tense. You develop chronic nausea or diarrhea. Get help right away if: You have a racing heart and shortness of breath. You have thoughts of hurting yourself or others. If you ever feel like you may hurt yourself or others, or have thoughts about taking your own life, get help right away. Go to your nearest emergency department or: Call your local emergency services (911 in the [place] Call a suicide crisis helpline, such as the [place] at 857-171-4356 or 988 in the [place]. This is open 24 hours a day in the [place] Text the Crisis Text Line at (306)098-9919 (in the [place].). Summary Taking steps to learn and use tension reduction techniques can help calm you and help prevent triggering an anxiety reaction. When used together, medicines, psychotherapy, and tension reduction techniques may be the most effective treatment. Family, friends, and partners can play a big part in supporting you. This information is not intended to replace advice given to you by your health care provider. Make sure you discuss any questions you have with your health care provider. Document Revised: 08/10/2020 Document Reviewed: 05/08/2020 Elsevier Patient Education  [place].

## 2021-08-22 LAB — CARDIOVASCULAR RISK ASSESSMENT

## 2021-08-22 LAB — COMPREHENSIVE METABOLIC PANEL
ALT: 12 IU/L (ref 0–32)
AST: 11 IU/L (ref 0–40)
Albumin/Globulin Ratio: 1.6 (ref 1.2–2.2)
Albumin: 4.2 g/dL (ref 3.9–4.9)
Alkaline Phosphatase: 120 IU/L (ref 44–121)
BUN/Creatinine Ratio: 17 (ref 9–23)
BUN: 13 mg/dL (ref 6–24)
Bilirubin Total: <0.2 mg/dL (ref 0.0–1.2)
CO2: 26 mmol/L (ref 20–29)
Calcium: 9.3 mg/dL (ref 8.7–10.2)
Chloride: 102 mmol/L (ref 96–106)
Creatinine, Ser: 0.77 mg/dL (ref 0.57–1.00)
Globulin, Total: 2.6 g/dL (ref 1.5–4.5)
Glucose: 165 mg/dL — ABNORMAL HIGH (ref 70–99)
Potassium: 5 mmol/L (ref 3.5–5.2)
Sodium: 140 mmol/L (ref 134–144)
Total Protein: 6.8 g/dL (ref 6.0–8.5)
eGFR: 95 mL/min/{1.73_m2} (ref >59)

## 2021-08-22 LAB — LIPID PANEL
Chol/HDL Ratio: 3 ratio (ref 0.0–4.4)
Cholesterol, Total: 140 mg/dL (ref 100–199)
HDL: 47 mg/dL (ref >39)
LDL Chol Calc (NIH): 74 mg/dL (ref 0–99)
Triglycerides: 100 mg/dL (ref 0–149)
VLDL Cholesterol Cal: 19 mg/dL (ref 5–40)

## 2021-08-22 LAB — CBC WITH DIFFERENTIAL/PLATELET
Basophils Absolute: 0.1 10*3/uL (ref 0.0–0.2)
Basos: 1 %
EOS (ABSOLUTE): 0.2 10*3/uL (ref 0.0–0.4)
Eos: 3 %
Hematocrit: 42.5 % (ref 34.0–46.6)
Hemoglobin: 14.1 g/dL (ref 11.1–15.9)
Immature Grans (Abs): 0 10*3/uL (ref 0.0–0.1)
Immature Granulocytes: 0 %
Lymphocytes Absolute: 2.3 10*3/uL (ref 0.7–3.1)
Lymphs: 33 %
MCH: 31.5 pg (ref 26.6–33.0)
MCHC: 33.2 g/dL (ref 31.5–35.7)
MCV: 95 fL (ref 79–97)
Monocytes Absolute: 0.4 10*3/uL (ref 0.1–0.9)
Monocytes: 6 %
Neutrophils Absolute: 3.9 10*3/uL (ref 1.4–7.0)
Neutrophils: 57 %
Platelets: 303 10*3/uL (ref 150–450)
RBC: 4.47 x10E6/uL (ref 3.77–5.28)
RDW: 12.6 % (ref 11.7–15.4)
WBC: 6.9 10*3/uL (ref 3.4–10.8)

## 2021-08-22 LAB — HEMOGLOBIN A1C
Est. average glucose Bld gHb Est-mCnc: 128 mg/dL
Hgb A1c MFr Bld: 6.1 % — ABNORMAL HIGH (ref 4.8–5.6)

## 2021-08-22 LAB — VITAMIN D 25 HYDROXY (VIT D DEFICIENCY, FRACTURES): Vit D, 25-Hydroxy: 22.6 ng/mL — ABNORMAL LOW (ref 30.0–100.0)

## 2021-08-28 ENCOUNTER — Other Ambulatory Visit: Payer: Self-pay

## 2021-08-28 DIAGNOSIS — R7303 Prediabetes: Secondary | ICD-10-CM

## 2021-08-28 MED ORDER — VITAMIN D (ERGOCALCIFEROL) 1.25 MG (50000 UNIT) PO CAPS: 50000.0000 [IU] | ORAL_CAPSULE | ORAL | 0 refills | Status: DC

## 2021-08-28 MED ORDER — METFORMIN HCL 500 MG PO TABS: 1000.0000 mg | ORAL_TABLET | Freq: Two times a day (BID) | ORAL | 0 refills | Status: DC

## 2021-08-29 ENCOUNTER — Other Ambulatory Visit: Payer: Self-pay | Admitting: Nurse Practitioner

## 2021-08-29 DIAGNOSIS — M792 Neuralgia and neuritis, unspecified: Secondary | ICD-10-CM

## 2021-12-14 ENCOUNTER — Other Ambulatory Visit: Payer: Self-pay

## 2021-12-14 DIAGNOSIS — R7303 Prediabetes: Secondary | ICD-10-CM

## 2021-12-14 DIAGNOSIS — M792 Neuralgia and neuritis, unspecified: Secondary | ICD-10-CM

## 2021-12-14 MED ORDER — GABAPENTIN 300 MG PO CAPS: 300.0000 mg | ORAL_CAPSULE | Freq: Three times a day (TID) | ORAL | 0 refills | Status: DC

## 2021-12-14 MED ORDER — METFORMIN HCL 500 MG PO TABS: 1000.0000 mg | ORAL_TABLET | Freq: Two times a day (BID) | ORAL | 0 refills | Status: DC

## 2022-04-19 ENCOUNTER — Ambulatory Visit: Payer: Self-pay | Admitting: Family Medicine

## 2022-04-19 ENCOUNTER — Other Ambulatory Visit: Payer: Self-pay

## 2022-04-19 DIAGNOSIS — M792 Neuralgia and neuritis, unspecified: Secondary | ICD-10-CM

## 2022-04-19 MED ORDER — GABAPENTIN 300 MG PO CAPS: 300.0000 mg | ORAL_CAPSULE | Freq: Three times a day (TID) | ORAL | 0 refills | Status: DC

## 2022-05-23 ENCOUNTER — Encounter: Payer: Self-pay | Admitting: Nurse Practitioner

## 2022-05-23 ENCOUNTER — Encounter: Payer: Self-pay | Admitting: Family Medicine

## 2022-05-23 NOTE — Progress Notes (Signed)
No show. Dr. Sedalia Muta  This encounter was created in error - please disregard.

## 2022-06-05 ENCOUNTER — Telehealth: Payer: Self-pay | Admitting: Nurse Practitioner

## 2022-06-05 NOTE — Telephone Encounter (Signed)
RECEIVED RETURN MAIL - CHANGED ADDRESS WITH NO FORWARD ADDRESS. TRIED TO CALL TO GET AN UPDATED ADDRESS. NO ANSWER AND VOICEMAIL IS FULL.

## 2022-07-24 ENCOUNTER — Encounter: Payer: Self-pay | Admitting: Family Medicine

## 2022-07-24 ENCOUNTER — Ambulatory Visit (INDEPENDENT_AMBULATORY_CARE_PROVIDER_SITE_OTHER): Payer: Medicare Other | Admitting: Family Medicine

## 2022-07-24 VITALS — BP 132/84 | HR 105 | Temp 97.5°F | Ht 60.0 in | Wt 173.0 lb

## 2022-07-24 DIAGNOSIS — Z6833 Body mass index (BMI) 33.0-33.9, adult: Secondary | ICD-10-CM

## 2022-07-24 DIAGNOSIS — E782 Mixed hyperlipidemia: Secondary | ICD-10-CM | POA: Diagnosis not present

## 2022-07-24 DIAGNOSIS — Z1159 Encounter for screening for other viral diseases: Secondary | ICD-10-CM

## 2022-07-24 DIAGNOSIS — I10 Essential (primary) hypertension: Secondary | ICD-10-CM

## 2022-07-24 DIAGNOSIS — K219 Gastro-esophageal reflux disease without esophagitis: Secondary | ICD-10-CM | POA: Diagnosis not present

## 2022-07-24 DIAGNOSIS — G2581 Restless legs syndrome: Secondary | ICD-10-CM

## 2022-07-24 DIAGNOSIS — F339 Major depressive disorder, recurrent, unspecified: Secondary | ICD-10-CM

## 2022-07-24 DIAGNOSIS — R7303 Prediabetes: Secondary | ICD-10-CM | POA: Diagnosis not present

## 2022-07-24 DIAGNOSIS — M545 Low back pain, unspecified: Secondary | ICD-10-CM

## 2022-07-24 DIAGNOSIS — F1911 Other psychoactive substance abuse, in remission: Secondary | ICD-10-CM

## 2022-07-24 DIAGNOSIS — E559 Vitamin D deficiency, unspecified: Secondary | ICD-10-CM

## 2022-07-24 DIAGNOSIS — Z1231 Encounter for screening mammogram for malignant neoplasm of breast: Secondary | ICD-10-CM

## 2022-07-24 DIAGNOSIS — F1210 Cannabis abuse, uncomplicated: Secondary | ICD-10-CM

## 2022-07-24 DIAGNOSIS — F411 Generalized anxiety disorder: Secondary | ICD-10-CM

## 2022-07-24 DIAGNOSIS — F17218 Nicotine dependence, cigarettes, with other nicotine-induced disorders: Secondary | ICD-10-CM

## 2022-07-24 DIAGNOSIS — Z114 Encounter for screening for human immunodeficiency virus [HIV]: Secondary | ICD-10-CM

## 2022-07-24 DIAGNOSIS — F4310 Post-traumatic stress disorder, unspecified: Secondary | ICD-10-CM | POA: Insufficient documentation

## 2022-07-24 MED ORDER — PRAMIPEXOLE DIHYDROCHLORIDE 0.125 MG PO TABS: 0.1250 mg | ORAL_TABLET | Freq: Every evening | ORAL | 2 refills | Status: DC

## 2022-07-24 NOTE — Assessment & Plan Note (Signed)
Patient educated in importance of not purchasing controlled substances from off the street (i.e. xanax) aware to discontinue use. Patient verbalized understanding. Information provided for [facility] and Dr. Giarmo- patient to contact and schedule appts. 

## 2022-07-24 NOTE — Assessment & Plan Note (Signed)
Labs drawn today

## 2022-07-24 NOTE — Assessment & Plan Note (Signed)
Continue to work on eating a healthy diet and exercise.  Labs drawn today.  

## 2022-07-24 NOTE — Assessment & Plan Note (Signed)
Lumbar Xray ordered.

## 2022-07-24 NOTE — Assessment & Plan Note (Signed)
Start Mirapex.

## 2022-07-24 NOTE — Patient Instructions (Addendum)
[facility]  -write down all medications you have taken in the past for Depression and Anxiety- [phone] [address] [place], [place] [address]   Dr. Luther Hearing [facility] [phone]   [address] [place]  [place], [place] [address]

## 2022-07-24 NOTE — Assessment & Plan Note (Signed)
Well controlled.  ° °Continue to work on eating a healthy diet and exercise.  °Labs drawn today.  ° °

## 2022-07-24 NOTE — Assessment & Plan Note (Signed)
The current medical regimen is effective;  continue present plan and medications.  

## 2022-07-24 NOTE — Assessment & Plan Note (Signed)
Patient educated in importance of not purchasing controlled substances from off the street (i.e. xanax) aware to discontinue use. Patient verbalized understanding. Information provided for [facility] and Dr. Luther Hearing- patient to contact and schedule appts.
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
## 2022-07-25 LAB — CBC WITH DIFFERENTIAL/PLATELET
Basophils Absolute: 0 10*3/uL (ref 0.0–0.2)
Basos: 1 %
EOS (ABSOLUTE): 0.1 10*3/uL (ref 0.0–0.4)
Eos: 2 %
Hematocrit: 45.2 % (ref 34.0–46.6)
Hemoglobin: 15 g/dL (ref 11.1–15.9)
Immature Grans (Abs): 0 10*3/uL (ref 0.0–0.1)
Immature Granulocytes: 0 %
Lymphocytes Absolute: 1.1 10*3/uL (ref 0.7–3.1)
Lymphs: 22 %
MCH: 30.4 pg (ref 26.6–33.0)
MCHC: 33.2 g/dL (ref 31.5–35.7)
MCV: 92 fL (ref 79–97)
Monocytes Absolute: 0.4 10*3/uL (ref 0.1–0.9)
Monocytes: 8 %
Neutrophils Absolute: 3.3 10*3/uL (ref 1.4–7.0)
Neutrophils: 67 %
Platelets: 332 10*3/uL (ref 150–450)
RBC: 4.93 x10E6/uL (ref 3.77–5.28)
RDW: 12.8 % (ref 11.7–15.4)
WBC: 4.9 10*3/uL (ref 3.4–10.8)

## 2022-07-25 LAB — COMPREHENSIVE METABOLIC PANEL
ALT: 7 IU/L (ref 0–32)
AST: 10 IU/L (ref 0–40)
Albumin: 4.2 g/dL (ref 3.9–4.9)
Alkaline Phosphatase: 119 IU/L (ref 44–121)
BUN/Creatinine Ratio: 13 (ref 9–23)
BUN: 10 mg/dL (ref 6–24)
Bilirubin Total: 0.2 mg/dL (ref 0.0–1.2)
CO2: 24 mmol/L (ref 20–29)
Calcium: 9.2 mg/dL (ref 8.7–10.2)
Chloride: 102 mmol/L (ref 96–106)
Creatinine, Ser: 0.75 mg/dL (ref 0.57–1.00)
Globulin, Total: 3.1 g/dL (ref 1.5–4.5)
Glucose: 130 mg/dL — ABNORMAL HIGH (ref 70–99)
Potassium: 4.9 mmol/L (ref 3.5–5.2)
Sodium: 141 mmol/L (ref 134–144)
Total Protein: 7.3 g/dL (ref 6.0–8.5)
eGFR: 98 mL/min/{1.73_m2} (ref >59)

## 2022-07-25 LAB — HCV AB W REFLEX TO QUANT PCR: HCV Ab: NONREACTIVE

## 2022-07-25 LAB — VITAMIN D 25 HYDROXY (VIT D DEFICIENCY, FRACTURES): Vit D, 25-Hydroxy: 24.6 ng/mL — ABNORMAL LOW (ref 30.0–100.0)

## 2022-07-25 LAB — LIPID PANEL
Chol/HDL Ratio: 3.3 ratio (ref 0.0–4.4)
Cholesterol, Total: 180 mg/dL (ref 100–199)
HDL: 54 mg/dL (ref >39)
LDL Chol Calc (NIH): 114 mg/dL — ABNORMAL HIGH (ref 0–99)
Triglycerides: 61 mg/dL (ref 0–149)
VLDL Cholesterol Cal: 12 mg/dL (ref 5–40)

## 2022-07-25 LAB — HCV INTERPRETATION

## 2022-07-25 LAB — HIV ANTIBODY (ROUTINE TESTING W REFLEX): HIV Screen 4th Generation wRfx: NONREACTIVE

## 2022-07-25 LAB — HEMOGLOBIN A1C
Est. average glucose Bld gHb Est-mCnc: 128 mg/dL
Hgb A1c MFr Bld: 6.1 % — ABNORMAL HIGH (ref 4.8–5.6)

## 2022-07-25 LAB — TSH: TSH: 2.12 u[IU]/mL (ref 0.450–4.500)

## 2022-07-26 DIAGNOSIS — F1210 Cannabis abuse, uncomplicated: Secondary | ICD-10-CM | POA: Insufficient documentation

## 2022-07-28 DIAGNOSIS — Z1159 Encounter for screening for other viral diseases: Secondary | ICD-10-CM | POA: Insufficient documentation

## 2022-07-28 DIAGNOSIS — Z114 Encounter for screening for human immunodeficiency virus [HIV]: Secondary | ICD-10-CM | POA: Insufficient documentation

## 2022-07-28 DIAGNOSIS — Z1231 Encounter for screening mammogram for malignant neoplasm of breast: Secondary | ICD-10-CM | POA: Insufficient documentation

## 2022-07-28 DIAGNOSIS — F17200 Nicotine dependence, unspecified, uncomplicated: Secondary | ICD-10-CM | POA: Insufficient documentation

## 2022-07-28 DIAGNOSIS — Z6833 Body mass index (BMI) 33.0-33.9, adult: Secondary | ICD-10-CM | POA: Insufficient documentation

## 2022-07-28 DIAGNOSIS — Z0000 Encounter for general adult medical examination without abnormal findings: Secondary | ICD-10-CM | POA: Insufficient documentation

## 2022-07-28 NOTE — Assessment & Plan Note (Signed)
Recommended cessation 

## 2022-07-30 ENCOUNTER — Other Ambulatory Visit: Payer: Self-pay

## 2022-07-30 MED ORDER — ATORVASTATIN CALCIUM 10 MG PO TABS: 10.0000 mg | ORAL_TABLET | Freq: Every day | ORAL | 0 refills | Status: DC

## 2022-10-18 ENCOUNTER — Other Ambulatory Visit: Payer: Self-pay | Admitting: Family Medicine

## 2022-11-03 ENCOUNTER — Other Ambulatory Visit: Payer: Self-pay

## 2022-11-06 ENCOUNTER — Other Ambulatory Visit: Payer: Self-pay

## 2022-11-06 DIAGNOSIS — R7303 Prediabetes: Secondary | ICD-10-CM

## 2022-11-06 MED ORDER — PRAMIPEXOLE DIHYDROCHLORIDE 0.125 MG PO TABS: 0.1250 mg | ORAL_TABLET | Freq: Every day | ORAL | 0 refills | Status: DC

## 2022-11-07 MED ORDER — METFORMIN HCL 500 MG PO TABS
500.0000 mg | ORAL_TABLET | Freq: Every day | ORAL | 0 refills | Status: DC
Start: 2022-11-07 — End: 2023-05-20
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
## 2022-11-13 ENCOUNTER — Ambulatory Visit (INDEPENDENT_AMBULATORY_CARE_PROVIDER_SITE_OTHER): Payer: Medicare HMO

## 2022-11-13 VITALS — BP 128/78 | HR 84 | Temp 97.6°F | Ht 60.0 in | Wt 180.6 lb

## 2022-11-13 DIAGNOSIS — J449 Chronic obstructive pulmonary disease, unspecified: Secondary | ICD-10-CM | POA: Diagnosis not present

## 2022-11-13 DIAGNOSIS — M545 Low back pain, unspecified: Secondary | ICD-10-CM | POA: Diagnosis not present

## 2022-11-13 DIAGNOSIS — G8929 Other chronic pain: Secondary | ICD-10-CM | POA: Diagnosis not present

## 2022-11-13 DIAGNOSIS — Z124 Encounter for screening for malignant neoplasm of cervix: Secondary | ICD-10-CM

## 2022-11-13 DIAGNOSIS — Z0000 Encounter for general adult medical examination without abnormal findings: Secondary | ICD-10-CM

## 2022-11-13 MED ORDER — ALBUTEROL SULFATE HFA 108 (90 BASE) MCG/ACT IN AERS: 2.0000 | INHALATION_SPRAY | Freq: Four times a day (QID) | RESPIRATORY_TRACT | 2 refills | Status: DC | PRN

## 2022-11-13 NOTE — Assessment & Plan Note (Signed)
States this is one of the reasons for her disability, reports chronic pain, takes gabapentin 300 mg 3 times daily.

## 2022-11-13 NOTE — Assessment & Plan Note (Signed)
Medicare wellness visit done Patient declined all vaccines, did not want to have Pap smear today, declined colonoscopy. Offered help with tobacco use, unfortunately she has tried several medications and tobacco quitting aids before with no help.  She will continue to work on cutting back. She will need a low-dose lung cancer screening CT January 2025 Medicare wellness template reviewed Return with PCP in 3 months for chronic disease management

## 2022-11-13 NOTE — Assessment & Plan Note (Signed)
Only uses albuterol inhaler as needed, at least once daily.  Refilled the medication.

## 2022-11-13 NOTE — Addendum Note (Signed)
Addended by: Precious Reel on: 11/13/2022 11:18 AM   Modules accepted: Orders

## 2022-11-13 NOTE — Patient Instructions (Addendum)
Please return for blood work and visit with Dr.Cox in 3 months Refilled albuterol inhaler Please try to cut back on the number of cigarettes.    Theresa Bowman , Thank you for taking time to come for your Medicare Wellness Visit. I appreciate your ongoing commitment to your health goals. Please review the following plan we discussed and let me know if I can assist you in the future.   These are the goals we discussed:  Goals   None     This is a list of the screening recommended for you and due dates:  Health Maintenance  Topic Date Due   Flu Shot  04/29/2023*   Colon Cancer Screening  07/24/2023*   Medicare Annual Wellness Visit  11/13/2023   Hepatitis C Screening  Completed   HIV Screening  Completed   HPV Vaccine  Aged Out   DTaP/Tdap/Td vaccine  Discontinued   COVID-19 Vaccine  Discontinued  *Topic was postponed. The date shown is not the original due date.

## 2022-11-27 DIAGNOSIS — H2513 Age-related nuclear cataract, bilateral: Secondary | ICD-10-CM | POA: Diagnosis not present

## 2022-11-27 DIAGNOSIS — H524 Presbyopia: Secondary | ICD-10-CM | POA: Diagnosis not present

## 2022-11-27 DIAGNOSIS — H52223 Regular astigmatism, bilateral: Secondary | ICD-10-CM | POA: Diagnosis not present

## 2022-11-27 DIAGNOSIS — H5213 Myopia, bilateral: Secondary | ICD-10-CM | POA: Diagnosis not present

## 2023-02-15 ENCOUNTER — Ambulatory Visit (INDEPENDENT_AMBULATORY_CARE_PROVIDER_SITE_OTHER): Payer: Medicare HMO

## 2023-02-15 VITALS — BP 108/76 | HR 86 | Temp 97.3°F | Ht 60.0 in | Wt 182.0 lb

## 2023-02-15 DIAGNOSIS — G2581 Restless legs syndrome: Secondary | ICD-10-CM | POA: Diagnosis not present

## 2023-02-15 DIAGNOSIS — E7800 Pure hypercholesterolemia, unspecified: Secondary | ICD-10-CM | POA: Diagnosis not present

## 2023-02-15 DIAGNOSIS — G8929 Other chronic pain: Secondary | ICD-10-CM

## 2023-02-15 DIAGNOSIS — M545 Low back pain, unspecified: Secondary | ICD-10-CM

## 2023-02-15 DIAGNOSIS — E559 Vitamin D deficiency, unspecified: Secondary | ICD-10-CM | POA: Diagnosis not present

## 2023-02-15 DIAGNOSIS — F17210 Nicotine dependence, cigarettes, uncomplicated: Secondary | ICD-10-CM

## 2023-02-15 DIAGNOSIS — R7303 Prediabetes: Secondary | ICD-10-CM | POA: Diagnosis not present

## 2023-02-15 DIAGNOSIS — F17200 Nicotine dependence, unspecified, uncomplicated: Secondary | ICD-10-CM | POA: Insufficient documentation

## 2023-02-15 DIAGNOSIS — F4310 Post-traumatic stress disorder, unspecified: Secondary | ICD-10-CM

## 2023-02-15 MED ORDER — PRAMIPEXOLE DIHYDROCHLORIDE 0.125 MG PO TABS: 0.1250 mg | ORAL_TABLET | Freq: Every day | ORAL | 0 refills | Status: DC

## 2023-02-15 NOTE — Assessment & Plan Note (Addendum)
Managed with gabapentin 300 mg TID. Pain worsens in evenings and afternoons. Discussed increasing evening dose to 600 mg to better manage pain. Risks include drowsiness. Patient prefers to avoid additional pain medications. - Increase evening dose of gabapentin to 600 mg - Call for a refill sooner if needed      General Health Maintenance Patient is due for a mammogram. Discussed importance of regular screenings and vaccinations. - Check status of mammogram order - Encourage annual mammograms  Follow-up - Schedule follow-up visit in 4 months.
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
## 2023-02-15 NOTE — Assessment & Plan Note (Signed)
Family history of diabetes. Discussed importance of monitoring blood glucose levels. Patient takes metformin daily in the evening. - Order A1c test

## 2023-02-15 NOTE — Assessment & Plan Note (Signed)
High score of 17 on depression scale. Situational factors include living situation and caregiving responsibilities. Patient declines medication but is advised to seek help if needed. Primary goal is obtaining own living space to improve mental health. - Encourage seeking help if feeling down or depressed  Post-Traumatic Stress Disorder (PTSD) Severe symptoms including startle response and inability to watch suspenseful movies. Symptoms exacerbated by current living situation and caregiving responsibilities. - Encourage seeking help if PTSD symptoms worsen

## 2023-02-15 NOTE — Patient Instructions (Signed)
VISIT SUMMARY:  During your visit today, we discussed several health concerns, including your chronic low back pain, restless leg syndrome, depression, PTSD, borderline diabetes, vitamin D deficiency, COPD, and tobacco use. We reviewed your current medications and made some adjustments to better manage your symptoms. We also discussed the importance of regular health screenings and lifestyle changes to improve your overall well-being.  YOUR PLAN:  -CHRONIC BILATERAL LOW BACK PAIN WITH SCIATICA: Chronic low back pain with sciatica is a condition where pain radiates from the lower back down the legs. We have decided to increase your evening dose of gabapentin to 600 mg to help manage your pain better, especially in the evenings. Please call for a refill sooner if needed.  -RESTLESS LEG SYNDROME: Restless leg syndrome is a condition that causes an uncontrollable urge to move your legs, usually due to discomfort. Your current medication, pramipexole (Mirapex), is providing significant relief, and we will continue with this treatment. A refill has been ordered.  -DEPRESSION: Depression is a mood disorder that causes persistent feelings of sadness and loss of interest. Your high score on the depression scale indicates significant symptoms, likely influenced by your living situation and caregiving responsibilities. Although you declined medication, please seek help if you feel down or depressed.  -POST-TRAUMATIC STRESS DISORDER (PTSD): PTSD is a mental health condition triggered by a terrifying event, causing severe anxiety and flashbacks. Your symptoms are severe and exacerbated by your current living situation. Please seek help if your PTSD symptoms worsen.  -BORDERLINE DIABETES MELLITUS: Borderline diabetes is a condition where blood sugar levels are higher than normal but not high enough to be classified as diabetes. We discussed the importance of monitoring your blood glucose levels, and an A1c test has  been ordered to assess your condition.  -VITAMIN D DEFICIENCY: Vitamin D deficiency occurs when you don't have enough vitamin D in your body, which is essential for bone health. We will repeat your blood work to reassess your vitamin D levels before prescribing any supplements.  -CHRONIC OBSTRUCTIVE PULMONARY DISEASE (COPD): COPD is a chronic inflammatory lung disease that obstructs airflow from the lungs. You reported occasional use of your inhaler, and we discussed the importance of monitoring your symptoms and using the inhaler as needed.  -TOBACCO USE DISORDER: Tobacco use disorder is a dependence on tobacco products. You smoke about a pack of cigarettes daily, and we discussed the importance of reducing smoking. A low-dose lung cancer screening CT scan has been ordered, and you are encouraged to reduce smoking by 2-3 cigarettes per day.  -GENERAL HEALTH MAINTENANCE: Regular health maintenance is crucial for early detection and prevention of diseases. You are due for a mammogram, and we discussed the importance of regular screenings and vaccinations. Please ensure you have an annual mammogram.  INSTRUCTIONS:  Please schedule a follow-up visit in 4 months. Additionally, ensure you complete the A1c test, blood work for vitamin D levels, and the low-dose lung cancer screening CT scan as discussed.

## 2023-02-15 NOTE — Assessment & Plan Note (Signed)
Previous blood work indicated low vitamin D levels. Discussed repeating blood work to reassess levels before prescribing supplementation. Patient does not currently take over-the-counter vitamin D. - Order blood work to check vitamin D levels

## 2023-02-15 NOTE — Assessment & Plan Note (Signed)
Patient smokes about a pack of cigarettes daily. Discussed importance of reducing smoking and availability of lung cancer screening. Family history of cancer. Patient agrees to lung cancer screening. - Order low-dose lung cancer screening CT scan as she turned 52 and qualifies for it - Encourage reducing smoking by 2-3 cigarettes per day. She is not too keen to quit or seek help at this time. Has tried zyban with no results in the past. Time spent counseling 5 minutes

## 2023-02-15 NOTE — Assessment & Plan Note (Signed)
Managed with pramipexole (Mirapex). Patient reports significant relief with current medication. - Refill pramipexole (Mirapex)

## 2023-02-16 LAB — LIPID PANEL
Chol/HDL Ratio: 4 {ratio} (ref 0.0–4.4)
Cholesterol, Total: 172 mg/dL (ref 100–199)
HDL: 43 mg/dL (ref >39)
LDL Chol Calc (NIH): 110 mg/dL — ABNORMAL HIGH (ref 0–99)
Triglycerides: 104 mg/dL (ref 0–149)
VLDL Cholesterol Cal: 19 mg/dL (ref 5–40)

## 2023-02-16 LAB — HEMOGLOBIN A1C
Est. average glucose Bld gHb Est-mCnc: 137 mg/dL
Hgb A1c MFr Bld: 6.4 % — ABNORMAL HIGH (ref 4.8–5.6)

## 2023-02-16 LAB — VITAMIN D 25 HYDROXY (VIT D DEFICIENCY, FRACTURES): Vit D, 25-Hydroxy: 18.5 ng/mL — ABNORMAL LOW (ref 30.0–100.0)

## 2023-02-19 ENCOUNTER — Other Ambulatory Visit: Payer: Self-pay

## 2023-02-19 MED ORDER — VITAMIN D (ERGOCALCIFEROL) 1.25 MG (50000 UNIT) PO CAPS: 50000.0000 [IU] | ORAL_CAPSULE | ORAL | 0 refills | Status: DC

## 2023-02-20 ENCOUNTER — Ambulatory Visit (HOSPITAL_BASED_OUTPATIENT_CLINIC_OR_DEPARTMENT_OTHER): Payer: Medicare HMO | Admitting: Radiology

## 2023-03-12 ENCOUNTER — Ambulatory Visit: Payer: Self-pay

## 2023-03-12 DIAGNOSIS — R300 Dysuria: Secondary | ICD-10-CM | POA: Diagnosis not present

## 2023-03-12 DIAGNOSIS — R0981 Nasal congestion: Secondary | ICD-10-CM | POA: Diagnosis not present

## 2023-03-12 DIAGNOSIS — N3091 Cystitis, unspecified with hematuria: Secondary | ICD-10-CM | POA: Diagnosis not present

## 2023-03-12 DIAGNOSIS — R0982 Postnasal drip: Secondary | ICD-10-CM | POA: Diagnosis not present

## 2023-03-12 DIAGNOSIS — H9203 Otalgia, bilateral: Secondary | ICD-10-CM | POA: Diagnosis not present

## 2023-03-12 NOTE — Telephone Encounter (Addendum)
Copied from CRM [id number]. Topic: Clinical - Red Word Triage >> Mar 12, 2023  3:39 PM Carlatta H wrote: [facility] that prompted transfer to Nurse Triage: Patient is bleeding while using the bathroom//It started early this morning//Previous  issues with UTI   Chief Complaint: Blood in urine Symptoms: urgency, pelvic pain Frequency: Multiple times when using the bathroom Pertinent Negatives: Patient denies odor Disposition: [] ED /[] Urgent Care (no appt availability in office) / [x] Appointment(In office/virtual)/ []  [facility]/ [] Home Care/ [] Refused Recommended Disposition /[]  Mobile Bus/ []  Follow-up with PCP Additional Notes: Patient stated she has been having blood in her urine every time she has urinated today. There is also blood on the tissue when she wipes. She also passed 1-2 small clots. Patient stated she has not had a period in 20 years. She suspects that she has a UTI. Patient denies burning while urinating. She does have some pelvic pain and urinary urgency. Patient advised to be seen within 24 hrs per protocol. No availability with PCP until 2/13.  This RN offered to schedule appointment for 2/12 with another provider and patient declined. She only wants to see Dr. Faylene Kurtz.    Reason for Disposition  Blood in urine  (Exception: Could be normal menstrual bleeding.)  Answer Assessment - Initial Assessment Questions 1. ONSET: "When did the bleeding start?"      Today   2. EPISODES: "How many times has there been blood in the urine?"      Each time patient urinates  3. PAIN with URINATION: "Is there any pain with passing your urine?" If Yes, ask: "How bad is the pain?"  (Scale 1-10; or mild, moderate, severe)    - MILD: Complains slightly about urination hurting.    - MODERATE: Interferes with normal activities.      - SEVERE: Excruciating, unwilling or unable to urinate because of the pain.      No pain while urinating   4. ASSOCIATED SYMPTOMS: "Are you  passing urine more frequently than usual?"     Patient is having urgency  5. OTHER SYMPTOMS: "Do you have any other symptoms?" (e.g., back/flank pain, abdomen pain, vomiting)    Pt is having lower abd pain  Protocols used: Urine - Blood In-A-AH

## 2023-03-12 NOTE — Telephone Encounter (Signed)
Attempted to contact patient, unable to leave voicemail as it is not set up.

## 2023-05-07 ENCOUNTER — Telehealth: Payer: Self-pay

## 2023-05-07 NOTE — Telephone Encounter (Signed)
 Tried calling patient to speak with her about her mammogram being due, and where she would like this to be set up.

## 2023-05-20 ENCOUNTER — Other Ambulatory Visit: Payer: Self-pay

## 2023-05-20 ENCOUNTER — Other Ambulatory Visit: Payer: Self-pay | Admitting: Family Medicine

## 2023-05-20 DIAGNOSIS — M792 Neuralgia and neuritis, unspecified: Secondary | ICD-10-CM

## 2023-05-20 DIAGNOSIS — R7303 Prediabetes: Secondary | ICD-10-CM

## 2023-06-06 ENCOUNTER — Other Ambulatory Visit: Payer: Self-pay

## 2023-06-06 DIAGNOSIS — J449 Chronic obstructive pulmonary disease, unspecified: Secondary | ICD-10-CM

## 2023-06-14 ENCOUNTER — Ambulatory Visit

## 2023-06-14 ENCOUNTER — Ambulatory Visit: Payer: Medicare HMO

## 2023-06-14 ENCOUNTER — Other Ambulatory Visit: Payer: Self-pay

## 2023-06-14 VITALS — BP 128/82 | HR 92 | Temp 97.5°F | Ht 62.0 in | Wt 175.2 lb

## 2023-06-14 DIAGNOSIS — F322 Major depressive disorder, single episode, severe without psychotic features: Secondary | ICD-10-CM | POA: Diagnosis not present

## 2023-06-14 DIAGNOSIS — J449 Chronic obstructive pulmonary disease, unspecified: Secondary | ICD-10-CM

## 2023-06-14 DIAGNOSIS — I10 Essential (primary) hypertension: Secondary | ICD-10-CM

## 2023-06-14 DIAGNOSIS — E66811 Obesity, class 1: Secondary | ICD-10-CM

## 2023-06-14 DIAGNOSIS — R051 Acute cough: Secondary | ICD-10-CM | POA: Insufficient documentation

## 2023-06-14 DIAGNOSIS — E559 Vitamin D deficiency, unspecified: Secondary | ICD-10-CM

## 2023-06-14 DIAGNOSIS — E66812 Obesity, class 2: Secondary | ICD-10-CM | POA: Insufficient documentation

## 2023-06-14 DIAGNOSIS — F17200 Nicotine dependence, unspecified, uncomplicated: Secondary | ICD-10-CM

## 2023-06-14 DIAGNOSIS — R7303 Prediabetes: Secondary | ICD-10-CM

## 2023-06-14 DIAGNOSIS — E6609 Other obesity due to excess calories: Secondary | ICD-10-CM

## 2023-06-14 DIAGNOSIS — K219 Gastro-esophageal reflux disease without esophagitis: Secondary | ICD-10-CM | POA: Diagnosis not present

## 2023-06-14 DIAGNOSIS — A630 Anogenital (venereal) warts: Secondary | ICD-10-CM

## 2023-06-14 DIAGNOSIS — E782 Mixed hyperlipidemia: Secondary | ICD-10-CM

## 2023-06-14 DIAGNOSIS — Z1231 Encounter for screening mammogram for malignant neoplasm of breast: Secondary | ICD-10-CM | POA: Insufficient documentation

## 2023-06-14 DIAGNOSIS — F1121 Opioid dependence, in remission: Secondary | ICD-10-CM | POA: Diagnosis not present

## 2023-06-14 DIAGNOSIS — Z6832 Body mass index (BMI) 32.0-32.9, adult: Secondary | ICD-10-CM

## 2023-06-14 DIAGNOSIS — Z1239 Encounter for other screening for malignant neoplasm of breast: Secondary | ICD-10-CM

## 2023-06-14 MED ORDER — HYDROCOD POLI-CHLORPHE POLI ER 10-8 MG/5ML PO SUER: 5.0000 mL | Freq: Every evening | ORAL | 0 refills | Status: AC | PRN

## 2023-06-14 MED ORDER — ALBUTEROL SULFATE HFA 108 (90 BASE) MCG/ACT IN AERS: 2.0000 | INHALATION_SPRAY | Freq: Four times a day (QID) | RESPIRATORY_TRACT | 2 refills | Status: DC | PRN

## 2023-06-14 MED ORDER — HYDROCOD POLI-CHLORPHE POLI ER 10-8 MG/5ML PO SUER: 5.0000 mL | Freq: Every evening | ORAL | 0 refills | Status: DC | PRN

## 2023-06-14 MED ORDER — ALBUTEROL SULFATE HFA 108 (90 BASE) MCG/ACT IN AERS: 2.0000 | INHALATION_SPRAY | Freq: Four times a day (QID) | RESPIRATORY_TRACT | 2 refills | Status: AC | PRN

## 2023-06-14 MED ORDER — PRAMIPEXOLE DIHYDROCHLORIDE 0.125 MG PO TABS: 0.1250 mg | ORAL_TABLET | Freq: Every day | ORAL | 0 refills | Status: DC

## 2023-06-14 NOTE — Assessment & Plan Note (Signed)
 Pre-diabetes managed with metformin  500 mg daily. Recent weight loss noted. - Continue metformin  500 mg daily.

## 2023-06-14 NOTE — Assessment & Plan Note (Signed)
 The current medical regimen is effective;  PRILOSEC 20 MG DAILY. continue present plan and medications.  Advised smoking could contribute and make GERD worse Not willing to quit,

## 2023-06-14 NOTE — Assessment & Plan Note (Signed)
 Continues to smoke approximately one pack per day. Previous attempts to quit with bupropion and varenicline were unsuccessful. Smoking exacerbates respiratory symptoms. She is not currently interested in quitting. - Encourage reduction in tobacco use. - Discuss risks of smoking on respiratory health. TIME SPENT COUNSELING 5 MINS
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
## 2023-06-14 NOTE — Assessment & Plan Note (Signed)
 Not on any medications at this time Does not feel the need for any Will continue to monitor symptoms.

## 2023-06-14 NOTE — Assessment & Plan Note (Signed)
 Reports sobriety for few years now Will monitor PDMP reviewed today,

## 2023-06-14 NOTE — Assessment & Plan Note (Signed)
 She has lost about 7 pounds since her last visit.  On metformin  for prediabetes which could help.  Advised to continue to work on healthy diet and increased physical activity.

## 2023-06-14 NOTE — Patient Instructions (Signed)
  VISIT SUMMARY: You visited us  today due to a persistent cough and headache. We discussed your symptoms, smoking habits, and other health concerns, and made plans to address each issue.  YOUR PLAN: COUGH AND THROAT DRAINAGE: You have had a persistent cough for three days, likely due to post-nasal drip from allergies. -We prescribed a cough syrup with hydrocodone to help you rest. -We refilled your albuterol  inhaler and sent it to [facility] pharmacy.  NICOTINE DEPENDENCE: You continue to smoke about a pack of cigarettes a day, which worsens your respiratory symptoms. -We encourage you to reduce your tobacco use. -We discussed the risks of smoking on your respiratory health.  PRE-DIABETES: Your pre-diabetes is managed with metformin . -Continue taking metformin  500 mg daily.  VITAMIN D  DEFICIENCY: You previously had a vitamin D  deficiency and mistakenly took a high dose daily instead of weekly. -We will check your vitamin D  levels. -If your levels are normal, take over-the-counter vitamin D  (1000-2000 IU). -If your levels are low, we will prescribe a high-dose vitamin D .  GENITAL WARTS: You have a recurrence of genital warts and prefer surgical intervention and interferon treatment. -We will refer you to gynecology for evaluation and management. -We discussed your previous successful treatment with interferon.       Contains text generated by Abridge.       Contains text generated by [employer]bridge.

## 2023-06-14 NOTE — Addendum Note (Signed)
 Addended by: Yaslin Kirtley on: 06/14/2023 11:00 AM   Modules accepted: Level of Service

## 2023-06-14 NOTE — Assessment & Plan Note (Signed)
 Well controlled WITHOUT ANY MEDICATIONS Continue to work on eating a healthy diet and exercise. Advised to cut back on tobacco use Labs drawn today.

## 2023-06-14 NOTE — Telephone Encounter (Signed)
 Copied from CRM [id number]. Topic: Clinical - Prescription Issue >> Jun 14, 2023 10:35 AM Elle L wrote: Reason for CRM: The patient is requesting her chlorpheniramine-HYDROcodone (TUSSIONEX) 10-8 MG/5ML AND chlorpheniramine-HYDROcodone (TUSSIONEX) 10-8 MG/5ML prescription be sent to [facility] on [address], [place], [place] [address] as her pharmacy did not have it in stock.

## 2023-06-14 NOTE — Assessment & Plan Note (Signed)
 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 Lesions present on labia. She prefers surgical intervention and interferon treatment based on past success. - Refer to gynecology for evaluation and management. - Discuss previous successful treatment with interferon.     She also needs a pap smear but since I am placing a referral, will defer it to Gynecology

## 2023-06-14 NOTE — Assessment & Plan Note (Signed)
 Cough and throat drainage for three days, likely due to post-nasal drip from allergies. No chest pain or sputum production. Lungs are clear. Allergy list includes codeine and hydrocodone, but she reports tolerance to hydrocodone in cough syrup. - Prescribe cough syrup with hydrocodone, noting allergy list discrepancy. ADVISED THAT IN VIEW OF HER HISTORY, I WOULD KEEP IT TO A VERY SMALL REFILL OF 35 ML FOR 7 DAYS. I would not prescribe more. - Refill albuterol  inhaler and send to [facility] pharmacy. - advised to also try OTC mucinex,

## 2023-06-14 NOTE — Assessment & Plan Note (Signed)
 Previous vitamin D  deficiency treated with high-dose vitamin D . She mistakenly took daily instead of weekly dose. - Check vitamin D  levels. - If normal, recommend over-the-counter vitamin D  supplementation (1000-2000 IU). - If low, prescribe high-dose vitamin D .

## 2023-06-15 LAB — CBC WITH DIFFERENTIAL/PLATELET
Basophils Absolute: 0.1 10*3/uL (ref 0.0–0.2)
Basos: 1 %
EOS (ABSOLUTE): 0.2 10*3/uL (ref 0.0–0.4)
Eos: 3 %
Hematocrit: 44.3 % (ref 34.0–46.6)
Hemoglobin: 14.6 g/dL (ref 11.1–15.9)
Immature Grans (Abs): 0 10*3/uL (ref 0.0–0.1)
Immature Granulocytes: 0 %
Lymphocytes Absolute: 1 10*3/uL (ref 0.7–3.1)
Lymphs: 16 %
MCH: 30.5 pg (ref 26.6–33.0)
MCHC: 33 g/dL (ref 31.5–35.7)
MCV: 93 fL (ref 79–97)
Monocytes Absolute: 0.6 10*3/uL (ref 0.1–0.9)
Monocytes: 9 %
Neutrophils Absolute: 4.5 10*3/uL (ref 1.4–7.0)
Neutrophils: 71 %
Platelets: 345 10*3/uL (ref 150–450)
RBC: 4.79 x10E6/uL (ref 3.77–5.28)
RDW: 12.6 % (ref 11.7–15.4)
WBC: 6.4 10*3/uL (ref 3.4–10.8)

## 2023-06-15 LAB — LIPID PANEL
Chol/HDL Ratio: 3.8 ratio (ref 0.0–4.4)
Cholesterol, Total: 182 mg/dL (ref 100–199)
HDL: 48 mg/dL (ref >39)
LDL Chol Calc (NIH): 117 mg/dL — ABNORMAL HIGH (ref 0–99)
Triglycerides: 95 mg/dL (ref 0–149)
VLDL Cholesterol Cal: 17 mg/dL (ref 5–40)

## 2023-06-15 LAB — COMPREHENSIVE METABOLIC PANEL WITH GFR
ALT: 10 IU/L (ref 0–32)
AST: 12 IU/L (ref 0–40)
Albumin: 4.1 g/dL (ref 3.9–4.9)
Alkaline Phosphatase: 112 IU/L (ref 44–121)
BUN/Creatinine Ratio: 17 (ref 9–23)
BUN: 13 mg/dL (ref 6–24)
Bilirubin Total: 0.3 mg/dL (ref 0.0–1.2)
CO2: 23 mmol/L (ref 20–29)
Calcium: 8.9 mg/dL (ref 8.7–10.2)
Chloride: 99 mmol/L (ref 96–106)
Creatinine, Ser: 0.77 mg/dL (ref 0.57–1.00)
Globulin, Total: 3.1 g/dL (ref 1.5–4.5)
Glucose: 122 mg/dL — ABNORMAL HIGH (ref 70–99)
Potassium: 4.4 mmol/L (ref 3.5–5.2)
Sodium: 137 mmol/L (ref 134–144)
Total Protein: 7.2 g/dL (ref 6.0–8.5)
eGFR: 94 mL/min/{1.73_m2} (ref >59)

## 2023-06-15 LAB — HEMOGLOBIN A1C
Est. average glucose Bld gHb Est-mCnc: 131 mg/dL
Hgb A1c MFr Bld: 6.2 % — ABNORMAL HIGH (ref 4.8–5.6)

## 2023-06-15 LAB — VITAMIN D 25 HYDROXY (VIT D DEFICIENCY, FRACTURES): Vit D, 25-Hydroxy: 27.3 ng/mL — ABNORMAL LOW (ref 30.0–100.0)

## 2023-06-17 ENCOUNTER — Ambulatory Visit: Payer: Self-pay

## 2023-06-17 MED ORDER — HYDROCOD POLI-CHLORPHE POLI ER 10-8 MG/5ML PO SUER: 5.0000 mL | Freq: Every evening | ORAL | 0 refills | Status: AC | PRN

## 2023-06-19 DIAGNOSIS — E66811 Obesity, class 1: Secondary | ICD-10-CM | POA: Insufficient documentation

## 2023-06-19 NOTE — Assessment & Plan Note (Signed)
 Comorbidities of HTN, hyperlipidemia and prediabetes.   She has lost about 7 pounds since her last visit.   On metformin  for prediabetes which could help.  Advised to continue to work on healthy diet and increased physical activity.

## 2023-06-25 ENCOUNTER — Telehealth: Payer: Self-pay

## 2023-06-25 NOTE — Telephone Encounter (Signed)
 Copied from CRM [id number]. Topic: General - Other >> Jun 25, 2023  3:47 PM Essie A wrote: Reason for CRM: Patient called to report that the [facility] had not called her yet.  I gave the phone number so she can call them.

## 2023-08-15 ENCOUNTER — Ambulatory Visit (INDEPENDENT_AMBULATORY_CARE_PROVIDER_SITE_OTHER)

## 2023-08-15 VITALS — HR 95 | Temp 98.2°F | Ht 62.0 in | Wt 170.0 lb

## 2023-08-15 DIAGNOSIS — R7303 Prediabetes: Secondary | ICD-10-CM

## 2023-08-15 DIAGNOSIS — F17200 Nicotine dependence, unspecified, uncomplicated: Secondary | ICD-10-CM | POA: Diagnosis not present

## 2023-08-15 DIAGNOSIS — Z1231 Encounter for screening mammogram for malignant neoplasm of breast: Secondary | ICD-10-CM | POA: Diagnosis not present

## 2023-08-15 DIAGNOSIS — Z1211 Encounter for screening for malignant neoplasm of colon: Secondary | ICD-10-CM | POA: Insufficient documentation

## 2023-08-15 DIAGNOSIS — G2581 Restless legs syndrome: Secondary | ICD-10-CM

## 2023-08-15 DIAGNOSIS — M792 Neuralgia and neuritis, unspecified: Secondary | ICD-10-CM | POA: Insufficient documentation

## 2023-08-15 MED ORDER — GABAPENTIN 300 MG PO CAPS: 300.0000 mg | ORAL_CAPSULE | Freq: Three times a day (TID) | ORAL | 1 refills | Status: AC

## 2023-08-15 MED ORDER — PRAMIPEXOLE DIHYDROCHLORIDE 0.125 MG PO TABS: 0.1250 mg | ORAL_TABLET | Freq: Every day | ORAL | 1 refills | Status: AC

## 2023-08-15 NOTE — Assessment & Plan Note (Signed)
 Smokes almost a pack of cigarettes daily. Discussed health risks including cancer, heart disease, and stroke. Finds it difficult to quit but is encouraged to reduce smoking. - Encourage reduction in smoking to 5-6 cigarettes per day - Discuss smoking cessation strategies and support

## 2023-08-15 NOTE — Assessment & Plan Note (Signed)
 Chronic right-sided sciatica managed with gabapentin  300 mg three times daily, as needed for pain control. - refilled gabapentin  with a six-month supply - Encourage regular exercise and stretching

## 2023-08-15 NOTE — Assessment & Plan Note (Addendum)
 Restless legs syndrome managed with pramipexole  0.125 mg, sometimes requiring two doses per day. Reports grogginess as a side effect when taking two doses. Low iron levels can contribute to symptoms. - Prescribe pramipexole  with a six-month supply - Consider multivitamin with iron supplementation - Encourage regular exercise and stretching     Follow-up Follow up in three months for blood work and further evaluation. - Schedule follow-up appointment in three months - Plan for fasting blood work at next visit

## 2023-08-15 NOTE — Assessment & Plan Note (Signed)
 No family history of colon cancer, but discussed colon cancer screening options. - Order Cologuard stool test for colon cancer screening

## 2023-08-15 NOTE — Assessment & Plan Note (Signed)
 Last mammogram was in April 2023. Discussed need for regular screenings. . Prefers appointment details via text message due to spam calls. - Order mammogram for August 2025

## 2023-08-15 NOTE — Assessment & Plan Note (Signed)
 Prediabetes managed with metformin  500 mg once daily. Recent A1c was 6.2, down from 6.4, indicating improvement but still at risk for diabetes. - Continue metformin  500 mg once daily
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
## 2023-08-15 NOTE — Patient Instructions (Signed)
 VISIT SUMMARY:  Today, we reviewed your asthma, sciatica, prediabetes, GERD, restless legs syndrome, tobacco use, and general health maintenance. We made some adjustments to your medications and discussed strategies to help you manage your conditions better.  YOUR PLAN:  ASTHMA: Your asthma is managed with an albuterol  inhaler, which you use approximately every other day. -Continue using your albuterol  inhaler as needed.  SCIATICA: You have chronic right-sided sciatica managed with gabapentin . -Continue taking gabapentin  300 mg three times daily, and you can increase to four times daily if needed for pain control. -We have prescribed a six-month supply of gabapentin . -Engage in regular exercise and stretching to help manage your symptoms.  PREDIABETES: Your prediabetes is managed with metformin , and your recent A1c level has improved. -Continue taking metformin  500 mg once daily.  GASTROESOPHAGEAL REFLUX DISEASE (GERD): Your GERD is well-controlled with Prilosec. -Continue taking Prilosec 20 mg daily.  RESTLESS LEGS SYNDROME: Your restless legs syndrome is managed with pramipexole , but you sometimes experience grogginess when taking two doses. -Continue taking pramipexole  0.125 mg as needed, up to two doses per day. -We have prescribed a six-month supply of pramipexole . -Consider taking a multivitamin with iron to help manage your symptoms. -Engage in regular exercise and stretching.  TOBACCO USE DISORDER: You smoke almost a pack of cigarettes daily and find it challenging to quit. -Try to reduce your smoking to 5-6 cigarettes per day. -We discussed various smoking cessation strategies and support options.  GENERAL HEALTH MAINTENANCE: We discussed the importance of regular screenings and your preference for appointment details via text message. -We have ordered a mammogram for August 2025. -We have ordered a Cologuard stool test for colon cancer screening.  FOLLOW-UP: We need to  follow up in three months for blood work and further evaluation. -Schedule a follow-up appointment in three months. -Plan for fasting blood work at your next visit.

## 2023-08-16 DIAGNOSIS — Z6833 Body mass index (BMI) 33.0-33.9, adult: Secondary | ICD-10-CM | POA: Diagnosis not present

## 2023-08-16 DIAGNOSIS — Z1151 Encounter for screening for human papillomavirus (HPV): Secondary | ICD-10-CM | POA: Diagnosis not present

## 2023-08-16 DIAGNOSIS — Z01419 Encounter for gynecological examination (general) (routine) without abnormal findings: Secondary | ICD-10-CM | POA: Diagnosis not present

## 2023-08-16 DIAGNOSIS — R87610 Atypical squamous cells of undetermined significance on cytologic smear of cervix (ASC-US): Secondary | ICD-10-CM | POA: Diagnosis not present

## 2023-08-16 DIAGNOSIS — Z1272 Encounter for screening for malignant neoplasm of vagina: Secondary | ICD-10-CM | POA: Diagnosis not present

## 2023-08-16 DIAGNOSIS — R875 Abnormal microbiological findings in specimens from female genital organs: Secondary | ICD-10-CM | POA: Diagnosis not present

## 2023-08-16 DIAGNOSIS — N901 Moderate vulvar dysplasia: Secondary | ICD-10-CM | POA: Diagnosis not present

## 2023-08-21 ENCOUNTER — Other Ambulatory Visit: Payer: Self-pay

## 2023-08-21 DIAGNOSIS — R7303 Prediabetes: Secondary | ICD-10-CM

## 2023-10-28 DIAGNOSIS — N903 Dysplasia of vulva, unspecified: Secondary | ICD-10-CM | POA: Diagnosis not present

## 2023-10-28 DIAGNOSIS — N901 Moderate vulvar dysplasia: Secondary | ICD-10-CM | POA: Diagnosis not present

## 2023-10-28 DIAGNOSIS — N900 Mild vulvar dysplasia: Secondary | ICD-10-CM | POA: Diagnosis not present

## 2023-10-28 DIAGNOSIS — A630 Anogenital (venereal) warts: Secondary | ICD-10-CM | POA: Diagnosis not present

## 2023-11-12 DIAGNOSIS — N903 Dysplasia of vulva, unspecified: Secondary | ICD-10-CM | POA: Diagnosis not present

## 2023-11-18 ENCOUNTER — Ambulatory Visit

## 2023-11-19 NOTE — Assessment & Plan Note (Deleted)
 Theresa Bowman
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
## 2023-11-19 NOTE — Assessment & Plan Note (Deleted)
 SABRA

## 2023-11-20 ENCOUNTER — Ambulatory Visit

## 2023-11-20 DIAGNOSIS — E782 Mixed hyperlipidemia: Secondary | ICD-10-CM

## 2023-11-20 DIAGNOSIS — R7303 Prediabetes: Secondary | ICD-10-CM

## 2023-11-20 DIAGNOSIS — I10 Essential (primary) hypertension: Secondary | ICD-10-CM

## 2023-12-19 DIAGNOSIS — N901 Moderate vulvar dysplasia: Secondary | ICD-10-CM | POA: Diagnosis not present

## 2024-03-17 ENCOUNTER — Ambulatory Visit
